# Patient Record
Sex: Female | Born: 1965 | Hispanic: Yes | Smoking: Never smoker
Health system: Southern US, Community
[De-identification: ages and names within clinical notes are randomized; demographics above are authoritative.]

## PROBLEM LIST (undated history)

## (undated) DIAGNOSIS — Z87898 Personal history of other specified conditions: Secondary | ICD-10-CM

## (undated) DIAGNOSIS — Z9289 Personal history of other medical treatment: Secondary | ICD-10-CM

## (undated) DIAGNOSIS — R7303 Prediabetes: Secondary | ICD-10-CM

## (undated) DIAGNOSIS — N6009 Solitary cyst of unspecified breast: Secondary | ICD-10-CM

## (undated) DIAGNOSIS — E559 Vitamin D deficiency, unspecified: Secondary | ICD-10-CM

## (undated) HISTORY — DX: Vitamin D deficiency, unspecified: E55.9

## (undated) HISTORY — DX: Solitary cyst of unspecified breast: N60.09

## (undated) HISTORY — DX: Personal history of other specified conditions: Z87.898

## (undated) HISTORY — DX: Personal history of other medical treatment: Z92.89

## (undated) HISTORY — DX: Prediabetes: R73.03

---

## 2011-08-08 ENCOUNTER — Ambulatory Visit: Payer: Self-pay | Admitting: Internal Medicine

## 2014-04-14 ENCOUNTER — Ambulatory Visit: Payer: Self-pay

## 2014-04-15 DIAGNOSIS — N6009 Solitary cyst of unspecified breast: Secondary | ICD-10-CM

## 2014-04-15 HISTORY — DX: Solitary cyst of unspecified breast: N60.09

## 2014-04-25 ENCOUNTER — Ambulatory Visit: Payer: Self-pay

## 2015-09-06 ENCOUNTER — Encounter: Payer: Self-pay | Admitting: Nurse Practitioner

## 2015-09-06 ENCOUNTER — Ambulatory Visit (INDEPENDENT_AMBULATORY_CARE_PROVIDER_SITE_OTHER): Payer: 59 | Admitting: Nurse Practitioner

## 2015-09-06 VITALS — BP 112/62 | HR 80 | Temp 98.1°F | Ht 61.0 in | Wt 165.0 lb

## 2015-09-06 DIAGNOSIS — B351 Tinea unguium: Secondary | ICD-10-CM

## 2015-09-06 DIAGNOSIS — Z7189 Other specified counseling: Secondary | ICD-10-CM | POA: Diagnosis not present

## 2015-09-06 DIAGNOSIS — R252 Cramp and spasm: Secondary | ICD-10-CM | POA: Diagnosis not present

## 2015-09-06 DIAGNOSIS — F4541 Pain disorder exclusively related to psychological factors: Secondary | ICD-10-CM | POA: Diagnosis not present

## 2015-09-06 DIAGNOSIS — Z0000 Encounter for general adult medical examination without abnormal findings: Secondary | ICD-10-CM | POA: Insufficient documentation

## 2015-09-06 DIAGNOSIS — Z7689 Persons encountering health services in other specified circumstances: Secondary | ICD-10-CM

## 2015-09-06 NOTE — Patient Instructions (Signed)
Welcome to [facility]! Nice to meet you.   There are several choices over the counter- go to your local pharmacy   Lamisil   Clotrimazole   Fungi Nail  See you when you need me!

## 2015-09-06 NOTE — Assessment & Plan Note (Signed)
Discussed acute and chronic issues. Reviewed health maintenance measures, PFSHx, and immunizations. Obtain records from [facility].   

## 2015-09-06 NOTE — Progress Notes (Signed)
Patient ID: Adrienne Wallace, female    DOB: 29-Nov-1965  Age: 50 y.o. MRN: [id number]  CC: New Patient (Initial Visit)   HPI Adrienne Wallace presents for establishing care and CC of HA and leg cramps.   1) New Pt Info:   Immunizations- Unsure   Mammogram- [facility]   Pap- March 2017 approximately   2) Chronic Problems-  HA and legs are tender at night time   3) Acute Problems-  HA that start occipital and move to front    2 weeks ago started    Ibuprofen or excedrin and resting  Legs hurt at night- pulsating aching   Drinking good water    Adrienne Wallace has a past medical Adrienne of Adrienne of headache.   She has past surgical Adrienne that includes Cesarean section (1994).   Her family Adrienne includes Alzheimer's disease in her maternal grandmother; Drug abuse in her brother; Other in her mother.She reports that she has never smoked. She has never used smokeless tobacco. She reports that she drinks alcohol. She reports that she does not use illicit drugs.  No outpatient prescriptions prior to visit.   No facility-administered medications prior to visit.    ROS Review of Systems  Constitutional: Negative for fever, chills, diaphoresis and fatigue.  Respiratory: Negative for chest tightness, shortness of breath and wheezing.   Cardiovascular: Negative for chest pain, palpitations and leg swelling.  Gastrointestinal: Negative for nausea, vomiting and diarrhea.  Musculoskeletal: Positive for myalgias.  Skin: Negative for rash.  Neurological: Positive for headaches. Negative for dizziness, weakness and numbness.  Psychiatric/Behavioral: The patient is not nervous/anxious.     Objective:  BP 112/62 mmHg  Pulse 80  Temp(Src) 98.1 F (36.7 C) (Oral)  Ht 5\' 1"  (1.549 m)  Wt 165 lb (74.844 kg)  BMI 31.19 kg/m2  SpO2 97%  LMP 08/20/2015 (Approximate)  Physical Exam  Constitutional: She is oriented to person, place, and time. She appears well-developed and  well-nourished. No distress.  HENT:  Head: Normocephalic and atraumatic.  Right Ear: External ear normal.  Left Ear: External ear normal.  Cardiovascular: Normal rate and regular rhythm.   Pulmonary/Chest: Effort normal and breath sounds normal. No respiratory distress. She has no wheezes. She has no rales. She exhibits no tenderness.  Neurological: She is alert and oriented to person, place, and time.  Skin: Skin is warm and dry. No rash noted. She is not diaphoretic.  Great toenail (cannot remember, which foot) thick yellow nail only 10% top of nail on lateral side  Psychiatric: She has a normal mood and affect. Her behavior is normal. Judgment and thought content normal.   Assessment & Plan:   Adrienne Wallace was seen today for new patient (initial visit).  Diagnoses and all orders for this visit:  Encounter to establish care  Stress headaches  Cramp of both lower extremities   Adrienne Wallace does not currently have medications on file.  No orders of the defined types were placed in this encounter.     Follow-up: Return if symptoms worsen or fail to improve.

## 2015-09-12 DIAGNOSIS — B351 Tinea unguium: Secondary | ICD-10-CM | POA: Insufficient documentation

## 2015-09-12 NOTE — Assessment & Plan Note (Signed)
Stable currently and controlled with excedrin and rest, discussed heat, stretching, massages, OTCs, and possibly trying allergy medications. FU prn worsening/failure to improve.  (new to me)

## 2015-09-12 NOTE — Assessment & Plan Note (Addendum)
Likely fungal Want her to try OTC's first due to small amount of involvement  New to me FU prn worsening/failure to improve.

## 2015-09-12 NOTE — Assessment & Plan Note (Signed)
Discussed stretching before bedtime and proper direction to stretch, water intake, and possibly iron or b12 in a multivitamin form (new to me)

## 2016-08-22 ENCOUNTER — Encounter: Payer: Self-pay | Admitting: Advanced Practice Midwife

## 2016-08-22 ENCOUNTER — Ambulatory Visit (INDEPENDENT_AMBULATORY_CARE_PROVIDER_SITE_OTHER): Payer: 59 | Admitting: Advanced Practice Midwife

## 2016-08-22 VITALS — BP 120/80 | HR 88 | Ht 61.0 in | Wt 164.0 lb

## 2016-08-22 DIAGNOSIS — Z124 Encounter for screening for malignant neoplasm of cervix: Secondary | ICD-10-CM | POA: Diagnosis not present

## 2016-08-22 DIAGNOSIS — Z01419 Encounter for gynecological examination (general) (routine) without abnormal findings: Secondary | ICD-10-CM

## 2016-08-22 NOTE — Progress Notes (Signed)
Patient ID: Adrienne Wallace, female   DOB: Dec 03, 1965, 51 y.o.   MRN: [id number]     Gynecology Annual Exam  PCP: Carollee Leitz, NP  Chief Complaint:  Chief Complaint  Patient presents with  . Gynecologic Exam    History of Present Illness: Patient is a 51 y.o. [id number] presents for annual exam. The patient has no complaints today.   LMP: Patient's last menstrual period was 07/24/2016. Average Interval: regular, 30 days Duration of flow: 2 weeks Heavy Menses: yes Clots: no Intermenstrual Bleeding: no Postcoital Bleeding: no Dysmenorrhea: no   The patient is sexually active. She currently uses None for contraception. She denies dyspareunia.  The patient does occasionally perform self breast exams.  There is no notable family history of breast or ovarian cancer in her family.  The patient wears seatbelts: yes.   The patient has regular exercise: no.  She admits being active at work on her feet but denies cardiac workout. She admits to healthy diet and adequate hydration. She admits good support from family and friends.  The patient denies current symptoms of depression.    Review of Systems: Review of Systems  Constitutional: Negative.   HENT: Negative.   Eyes: Negative.   Respiratory: Negative.   Cardiovascular: Negative.   Gastrointestinal: Negative.   Genitourinary: Negative.   Musculoskeletal: Negative.   Skin: Negative.   Neurological: Negative.   Endo/Heme/Allergies: Negative.   Psychiatric/Behavioral: Negative.     Past Medical History:  Past Medical History:  Diagnosis Date  . History of headache     Past Surgical History:  Past Surgical History:  Procedure Laterality Date  . CESAREAN SECTION  1994    Gynecologic History:  Patient's last menstrual period was 07/24/2016. Contraception: none Last Pap: Results were: 1 year ago and no abnormalities  Last mammogram: 1 year ago and was normal Obstetric History: [id number]  Family History:  Family  History  Problem Relation Age of Onset  . Other Mother     blood clot in leg  . Drug abuse Brother   . Alzheimer's disease Maternal Grandmother     Social History:  Social History   Social History  . Marital status: Legally Separated    Spouse name: N/A  . Number of children: 4  . Years of education: College   Occupational History  .  Other    [employer]   Social History Main Topics  . Smoking status: Never Smoker  . Smokeless tobacco: Never Used  . Alcohol use 0.0 oz/week     Comment: occasionally  . Drug use: No  . Sexual activity: Yes    Birth control/ protection: None   Other Topics Concern  . Not on file   Social History Narrative   Patient lives at home with family    Husband and 4 children, and mother-in-Law    Caffeine- 1 cup daily    Exercise- walking    Works at [employer]        Allergies:  No Known Allergies  Medications: Prior to Admission medications   Not on File    Physical Exam Vitals: Blood pressure 120/80, pulse 88, height  (1.549 m), weight 164 lb (74.4 kg), last menstrual period 07/24/2016.  General: NAD HEENT: normocephalic, anicteric Thyroid: no enlargement, no palpable nodules Pulmonary: No increased work of breathing, CTAB Cardiovascular: RRR, distal pulses 2+ Breast: Breast symmetrical, no tenderness, no palpable nodules or masses, no skin or nipple retraction present, no nipple discharge.  No axillary  or supraclavicular lymphadenopathy. Abdomen: NABS, soft, non-tender, non-distended.  Umbilicus without lesions.  No hepatomegaly, splenomegaly or masses palpable. No evidence of hernia  Genitourinary:  External: Normal external female genitalia.  Normal urethral meatus, normal  Bartholin's and Skene's glands.    Vagina: Normal vaginal mucosa, no evidence of prolapse.    Cervix: Grossly normal in appearance, no bleeding  Uterus: Non-enlarged, mobile, normal contour.  No CMT  Adnexa: ovaries non-enlarged, no adnexal  masses  Rectal: deferred  Lymphatic: no evidence of inguinal lymphadenopathy Extremities: no edema, erythema, or tenderness Neurologic: Grossly intact Psychiatric: mood appropriate, affect full     Assessment: 51 y.o. [id number] Well Woman exam with PAP smear   Plan: Problem List Items Addressed This Visit    None      1) Mammogram - recommend yearly screening mammogram.  Mammogram patient will self schedule following today's annual exam  2) STI screening was offered and declined  3) ASCCP guidelines and rational discussed.  Patient opts for yearly screening interval  4) Contraception - Education given regarding options for contraception. Pt is aware that the chances of pregnancy are low but still possible. She chooses no method of contraception.  5) Continue healthy lifestyle diet and increase activity level to add cardiac benefits  6) Follow up in 1 year for annual   [place] Mall, [place]

## 2016-08-23 LAB — PAP IG (IMAGE GUIDED): PAP SMEAR COMMENT: 0

## 2016-10-22 ENCOUNTER — Other Ambulatory Visit: Payer: Self-pay | Admitting: Family

## 2016-10-22 ENCOUNTER — Ambulatory Visit (INDEPENDENT_AMBULATORY_CARE_PROVIDER_SITE_OTHER): Payer: 59 | Admitting: Family

## 2016-10-22 ENCOUNTER — Encounter: Payer: Self-pay | Admitting: Family

## 2016-10-22 VITALS — BP 122/76 | HR 79 | Temp 98.2°F | Ht 61.0 in | Wt 164.4 lb

## 2016-10-22 DIAGNOSIS — M7989 Other specified soft tissue disorders: Secondary | ICD-10-CM

## 2016-10-22 DIAGNOSIS — M25562 Pain in left knee: Secondary | ICD-10-CM

## 2016-10-22 NOTE — Progress Notes (Signed)
Subjective:    Patient ID: Adrienne Wallace, female    DOB: 09-16-1965, 51 y.o.   MRN: [id number]  CC: Adrienne Wallace is a 51 y.o. female who presents today for an acute visit.    HPI: Acute left knee pain x one month, unchanged.  No falls or injury. Describes swelling in left knee. No catching or giving way.   Brace helps. Hasn't tried any medication  Thinks walking on hard concrete at work bothers knee.  Also bothered  By varicose veins on both legs. No SOB, CP, palpitations, fever.  No h/o dvt.      HISTORY:  Past Medical History:  Diagnosis Date  . History of headache    Past Surgical History:  Procedure Laterality Date  . CESAREAN SECTION  1994   Family History  Problem Relation Age of Onset  . Other Mother        blood clot in leg  . Drug abuse Brother   . Alzheimer's disease Maternal Grandmother     Allergies: Patient has no known allergies. No current outpatient prescriptions on file prior to visit.   No current facility-administered medications on file prior to visit.     Social History  Substance Use Topics  . Smoking status: Never Smoker  . Smokeless tobacco: Never Used  . Alcohol use 0.0 oz/week     Comment: occasionally    Review of Systems  Constitutional: Negative for chills and fever.  Respiratory: Negative for cough.   Cardiovascular: Negative for chest pain and palpitations.  Gastrointestinal: Negative for nausea and vomiting.  Musculoskeletal: Positive for joint swelling. Negative for back pain.      Objective:    BP 122/76   Pulse 79   Temp 98.2 F (36.8 C) (Oral)   Ht 5\' 1"  (1.549 m)   Wt 164 lb 6.4 oz (74.6 kg)   SpO2 97%   BMI 31.06 kg/m    Physical Exam  Constitutional: She appears well-developed and well-nourished.  Eyes: Conjunctivae are normal.  Cardiovascular: Normal rate, regular rhythm, normal heart sounds and normal pulses.   No LE edema, palpable cords or masses. No erythema or increased warmth. Slight  asymmetry in left calf size when compared to right.  LE hair growth symmetric and present.  varicosities noted. LE warm and palpable pedal pulses.   Pulmonary/Chest: Effort normal and breath sounds normal. She has no wheezes. She has no rhonchi. She has no rales.  Musculoskeletal:       Left knee: She exhibits swelling. She exhibits normal range of motion, no effusion, no ecchymosis, no deformity and no erythema. No tenderness found.  Bilateral knees are symmetric. Left knee slightly larger than right.  No increase in warmth or erythema. Crepitus felt with flexion of bilateral knees.  Left  knee:  Able to extend to -5 to 10 degrees and flex to 110 degrees. No catching with McMurray maneuver. No patellar apprehension. Negative anterior drawer and lachman's- no laxity appreciated.  No calf tenderness of lower leg edema bilaterally.    Neurological: She is alert.  Skin: Skin is warm and dry.  Psychiatric: She has a normal mood and affect. Her speech is normal and behavior is normal. Thought content normal.  Vitals reviewed.      Assessment & Wallace:    1. Acute pain of left knee Suspect arthritic in nature. She does appear to have a mild joint effusion. We discussed conservative therapy including ice, compression to help improve this. Her left calf does  also appear larger than her right calf which may be baseline however pending stat ultrasound to ensure no DVT. Also referred to vascular due to varicosities noted on legs. Advised conservative therapy does not help with left knee pain, we will consult orthopedics. - US Venous Img Lower Unilateral Left - Ambulatory referral to Vascular Surgery   Adrienne Wallace does not currently have medications on file.   No orders of the defined types were placed in this encounter.   Return precautions given.   Risks, benefits, and alternatives of the medications and treatment Wallace prescribed today were discussed, and patient expressed understanding.     Education regarding symptom management and diagnosis given to patient on AVS.  Continue to follow with Allegra Grana, FNP for routine health maintenance.   Adrienne Wallace.   Rennie Plowman, FNP

## 2016-10-22 NOTE — Patient Instructions (Signed)
US left leg  Referral to vascular  As discussed, suspect  arthritis of left knee.  Ice Compression Elevation ( for swelling)  Ibuprofen as needed for pain

## 2016-10-22 NOTE — Progress Notes (Signed)
Pre visit review using our clinic review tool, if applicable. No additional management support is needed unless otherwise documented below in the visit note. 

## 2016-10-23 ENCOUNTER — Telehealth: Payer: Self-pay | Admitting: Family

## 2016-10-23 NOTE — Telephone Encounter (Signed)
Per patient request she needed to wait till Thursday 6/14 to get her ultrasound. I was not able to schedule for that day, so I scheduled it for Friday. She is not able to do Friday since she works 8-5:30. She requested her appt to be on Tuesday June 19. I have scheduled it for her on this date. I asked her how her leg was feeling. She said that it only hurts when she is standing at work. I told her if her leg starts swelling, warm to touch or pain level increases to please contact us.

## 2016-10-23 NOTE — Telephone Encounter (Signed)
Noted! Thank you

## 2016-10-25 ENCOUNTER — Ambulatory Visit: Payer: 59

## 2016-10-29 ENCOUNTER — Ambulatory Visit
Admission: RE | Admit: 2016-10-29 | Discharge: 2016-10-29 | Disposition: A | Discharge status: Home / Self Care | Payer: 59 | Source: Ambulatory Visit | Attending: Family | Admitting: Family

## 2016-10-29 DIAGNOSIS — M7989 Other specified soft tissue disorders: Secondary | ICD-10-CM | POA: Insufficient documentation

## 2017-01-06 ENCOUNTER — Other Ambulatory Visit: Payer: Self-pay | Admitting: Advanced Practice Midwife

## 2017-01-06 DIAGNOSIS — Z1231 Encounter for screening mammogram for malignant neoplasm of breast: Secondary | ICD-10-CM

## 2017-01-17 ENCOUNTER — Ambulatory Visit
Admission: RE | Admit: 2017-01-17 | Discharge: 2017-01-17 | Disposition: A | Discharge status: Home / Self Care | Payer: 59 | Source: Ambulatory Visit | Attending: Advanced Practice Midwife | Admitting: Advanced Practice Midwife

## 2017-01-17 DIAGNOSIS — Z1231 Encounter for screening mammogram for malignant neoplasm of breast: Secondary | ICD-10-CM | POA: Diagnosis present

## 2017-01-22 ENCOUNTER — Other Ambulatory Visit: Payer: Self-pay | Admitting: *Deleted

## 2017-01-22 ENCOUNTER — Inpatient Hospital Stay
Admission: RE | Admit: 2017-01-22 | Discharge: 2017-01-22 | Disposition: A | Discharge status: Home / Self Care | Payer: Self-pay | Source: Ambulatory Visit | Attending: *Deleted | Admitting: *Deleted

## 2017-01-22 DIAGNOSIS — Z9289 Personal history of other medical treatment: Secondary | ICD-10-CM

## 2017-01-30 ENCOUNTER — Encounter: Payer: Self-pay | Admitting: Family

## 2017-01-30 ENCOUNTER — Ambulatory Visit (INDEPENDENT_AMBULATORY_CARE_PROVIDER_SITE_OTHER): Payer: 59 | Admitting: Family

## 2017-01-30 VITALS — BP 128/78 | HR 83 | Temp 98.2°F | Ht 61.0 in | Wt 163.4 lb

## 2017-01-30 DIAGNOSIS — M25561 Pain in right knee: Secondary | ICD-10-CM | POA: Diagnosis not present

## 2017-01-30 DIAGNOSIS — Z0000 Encounter for general adult medical examination without abnormal findings: Secondary | ICD-10-CM | POA: Diagnosis not present

## 2017-01-30 DIAGNOSIS — G8929 Other chronic pain: Secondary | ICD-10-CM

## 2017-01-30 DIAGNOSIS — M25562 Pain in left knee: Secondary | ICD-10-CM

## 2017-01-30 LAB — COMPREHENSIVE METABOLIC PANEL
ALK PHOS: 66 U/L (ref 39–117)
ALT: 10 U/L (ref 0–35)
AST: 13 U/L (ref 0–37)
Albumin: 3.8 g/dL (ref 3.5–5.2)
BILIRUBIN TOTAL: 0.3 mg/dL (ref 0.2–1.2)
BUN: 15 mg/dL (ref 6–23)
CO2: 28 mEq/L (ref 19–32)
CREATININE: 0.52 mg/dL (ref 0.40–1.20)
Calcium: 8.8 mg/dL (ref 8.4–10.5)
Chloride: 107 mEq/L (ref 96–112)
GFR: 132.97 mL/min (ref >60.00)
GLUCOSE: 95 mg/dL (ref 70–99)
Potassium: 4.1 mEq/L (ref 3.5–5.1)
SODIUM: 140 meq/L (ref 135–145)
TOTAL PROTEIN: 6.8 g/dL (ref 6.0–8.3)

## 2017-01-30 LAB — LIPID PANEL
CHOL/HDL RATIO: 3
Cholesterol: 174 mg/dL (ref 0–200)
HDL: 58.6 mg/dL (ref >39.00)
LDL Cholesterol: 96 mg/dL (ref 0–99)
NONHDL: 115.34
Triglycerides: 97 mg/dL (ref 0.0–149.0)
VLDL: 19.4 mg/dL (ref 0.0–40.0)

## 2017-01-30 LAB — CBC WITH DIFFERENTIAL/PLATELET
BASOS PCT: 0.6 % (ref 0.0–3.0)
Basophils Absolute: 0 10*3/uL (ref 0.0–0.1)
EOS ABS: 0.1 10*3/uL (ref 0.0–0.7)
EOS PCT: 2.5 % (ref 0.0–5.0)
HCT: 31.9 % — ABNORMAL LOW (ref 36.0–46.0)
Hemoglobin: 10.1 g/dL — ABNORMAL LOW (ref 12.0–15.0)
Lymphocytes Relative: 33.1 % (ref 12.0–46.0)
Lymphs Abs: 1.6 10*3/uL (ref 0.7–4.0)
MCHC: 31.5 g/dL (ref 30.0–36.0)
MCV: 82.4 fl (ref 78.0–100.0)
Monocytes Absolute: 0.3 10*3/uL (ref 0.1–1.0)
Monocytes Relative: 6.2 % (ref 3.0–12.0)
NEUTROS ABS: 2.8 10*3/uL (ref 1.4–7.7)
NEUTROS PCT: 57.6 % (ref 43.0–77.0)
Platelets: 331 10*3/uL (ref 150.0–400.0)
RBC: 3.87 Mil/uL (ref 3.87–5.11)
RDW: 16.7 % — ABNORMAL HIGH (ref 11.5–15.5)
WBC: 4.8 10*3/uL (ref 4.0–10.5)

## 2017-01-30 LAB — TSH: TSH: 2.21 u[IU]/mL (ref 0.35–4.50)

## 2017-01-30 LAB — VITAMIN D 25 HYDROXY (VIT D DEFICIENCY, FRACTURES): VITD: 6.96 ng/mL — AB (ref 30.00–100.00)

## 2017-01-30 LAB — HEMOGLOBIN A1C: Hgb A1c MFr Bld: 5.9 % (ref 4.6–6.5)

## 2017-01-30 MED ORDER — MELOXICAM 7.5 MG PO TABS: 7.5000 mg | ORAL_TABLET | Freq: Every day | ORAL | 1 refills | Status: DC

## 2017-01-30 NOTE — Patient Instructions (Addendum)
Tdap vaccine at local pharmacy  Labs today  Trial mobic every day. Please stop taking any ibuprofen, aleve, advil ect as these medications are similar to mobic  Ice  Ace wrap or compression stockings  Supportive shoes   If not better letter me know  Referral to derm to evaluate lesion on right leg.   Health Maintenance, Female Adopting a healthy lifestyle and getting preventive care can go a long way to promote health and wellness. Talk with your health care provider about what schedule of regular examinations is right for you. This is a good chance for you to check in with your provider about disease prevention and staying healthy. In between checkups, there are plenty of things you can do on your own. Experts have done a lot of research about which lifestyle changes and preventive measures are most likely to keep you healthy. Ask your health care provider for more information. Weight and diet Eat a healthy diet  Be sure to include plenty of vegetables, fruits, low-fat dairy products, and lean protein.  Do not eat a lot of foods high in solid fats, added sugars, or salt.  Get regular exercise. This is one of the most important things you can do for your health. ? Most adults should exercise for at least 150 minutes each week. The exercise should increase your heart rate and make you sweat (moderate-intensity exercise). ? Most adults should also do strengthening exercises at least twice a week. This is in addition to the moderate-intensity exercise.  Maintain a healthy weight  Body mass index (BMI) is a measurement that can be used to identify possible weight problems. It estimates body fat based on height and weight. Your health care provider can help determine your BMI and help you achieve or maintain a healthy weight.  For females 55 years of age and older: ? A BMI below 18.5 is considered underweight. ? A BMI of 18.5 to 24.9 is normal. ? A BMI of 25 to 29.9 is considered  overweight. ? A BMI of 30 and above is considered obese.  Watch levels of cholesterol and blood lipids  You should start having your blood tested for lipids and cholesterol at 49 years of age, then have this test every 5 years.  You may need to have your cholesterol levels checked more often if: ? Your lipid or cholesterol levels are high. ? You are older than 49 years of age. ? You are at high risk for heart disease.  Cancer screening Lung Cancer  Lung cancer screening is recommended for adults 9-39 years old who are at high risk for lung cancer because of a history of smoking.  A yearly low-dose CT scan of the lungs is recommended for people who: ? Currently smoke. ? Have quit within the past 15 years. ? Have at least a 30-pack-year history of smoking. A pack year is smoking an average of one pack of cigarettes a day for 1 year.  Yearly screening should continue until it has been 15 years since you quit.  Yearly screening should stop if you develop a health problem that would prevent you from having lung cancer treatment.  Breast Cancer  Practice breast self-awareness. This means understanding how your breasts normally appear and feel.  It also means doing regular breast self-exams. Let your health care provider know about any changes, no matter how small.  If you are in your 20s or 30s, you should have a clinical breast exam (CBE) by a health  care provider every 1-3 years as part of a regular health exam.  If you are 88 or older, have a CBE every year. Also consider having a breast X-ray (mammogram) every year.  If you have a family history of breast cancer, talk to your health care provider about genetic screening.  If you are at high risk for breast cancer, talk to your health care provider about having an MRI and a mammogram every year.  Breast cancer gene (BRCA) assessment is recommended for women who have family members with BRCA-related cancers. BRCA-related cancers  include: ? Breast. ? Ovarian. ? Tubal. ? Peritoneal cancers.  Results of the assessment will determine the need for genetic counseling and BRCA1 and BRCA2 testing.  Cervical Cancer Your health care provider may recommend that you be screened regularly for cancer of the pelvic organs (ovaries, uterus, and vagina). This screening involves a pelvic examination, including checking for microscopic changes to the surface of your cervix (Pap test). You may be encouraged to have this screening done every 3 years, beginning at age 67.  For women ages 15-65, health care providers may recommend pelvic exams and Pap testing every 3 years, or they may recommend the Pap and pelvic exam, combined with testing for human papilloma virus (HPV), every 5 years. Some types of HPV increase your risk of cervical cancer. Testing for HPV may also be done on women of any age with unclear Pap test results.  Other health care providers may not recommend any screening for nonpregnant women who are considered low risk for pelvic cancer and who do not have symptoms. Ask your health care provider if a screening pelvic exam is right for you.  If you have had past treatment for cervical cancer or a condition that could lead to cancer, you need Pap tests and screening for cancer for at least 20 years after your treatment. If Pap tests have been discontinued, your risk factors (such as having a new sexual partner) need to be reassessed to determine if screening should resume. Some women have medical problems that increase the chance of getting cervical cancer. In these cases, your health care provider may recommend more frequent screening and Pap tests.  Colorectal Cancer  This type of cancer can be detected and often prevented.  Routine colorectal cancer screening usually begins at 49 years of age and continues through 49 years of age.  Your health care provider may recommend screening at an earlier age if you have risk factors  for colon cancer.  Your health care provider may also recommend using home test kits to check for hidden blood in the stool.  A small camera at the end of a tube can be used to examine your colon directly (sigmoidoscopy or colonoscopy). This is done to check for the earliest forms of colorectal cancer.  Routine screening usually begins at age 21.  Direct examination of the colon should be repeated every 5-10 years through 49 years of age. However, you may need to be screened more often if early forms of precancerous polyps or small growths are found.  Skin Cancer  Check your skin from head to toe regularly.  Tell your health care provider about any new moles or changes in moles, especially if there is a change in a mole's shape or color.  Also tell your health care provider if you have a mole that is larger than the size of a pencil eraser.  Always use sunscreen. Apply sunscreen liberally and repeatedly throughout the  day.  Protect yourself by wearing long sleeves, pants, a wide-brimmed hat, and sunglasses whenever you are outside.  Heart disease, diabetes, and high blood pressure  High blood pressure causes heart disease and increases the risk of stroke. High blood pressure is more likely to develop in: ? People who have blood pressure in the high end of the normal range (130-139/85-89 mm Hg). ? People who are overweight or obese. ? People who are African American.  If you are 33-25 years of age, have your blood pressure checked every 3-5 years. If you are 54 years of age or older, have your blood pressure checked every year. You should have your blood pressure measured twice-once when you are at a hospital or clinic, and once when you are not at a hospital or clinic. Record the average of the two measurements. To check your blood pressure when you are not at a hospital or clinic, you can use: ? An automated blood pressure machine at a pharmacy. ? A home blood pressure monitor.  If  you are between 59 years and 73 years old, ask your health care provider if you should take aspirin to prevent strokes.  Have regular diabetes screenings. This involves taking a blood sample to check your fasting blood sugar level. ? If you are at a normal weight and have a low risk for diabetes, have this test once every three years after 49 years of age. ? If you are overweight and have a high risk for diabetes, consider being tested at a younger age or more often. Preventing infection Hepatitis B  If you have a higher risk for hepatitis B, you should be screened for this virus. You are considered at high risk for hepatitis B if: ? You were born in a country where hepatitis B is common. Ask your health care provider which countries are considered high risk. ? Your parents were born in a high-risk country, and you have not been immunized against hepatitis B (hepatitis B vaccine). ? You have HIV or AIDS. ? You use needles to inject street drugs. ? You live with someone who has hepatitis B. ? You have had sex with someone who has hepatitis B. ? You get hemodialysis treatment. ? You take certain medicines for conditions, including cancer, organ transplantation, and autoimmune conditions.  Hepatitis C  Blood testing is recommended for: ? Everyone born from [birth year] through 1963. ? Anyone with known risk factors for hepatitis C.  Sexually transmitted infections (STIs)  You should be screened for sexually transmitted infections (STIs) including gonorrhea and chlamydia if: ? You are sexually active and are younger than 49 years of age. ? You are older than 49 years of age and your health care provider tells you that you are at risk for this type of infection. ? Your sexual activity has changed since you were last screened and you are at an increased risk for chlamydia or gonorrhea. Ask your health care provider if you are at risk.  If you do not have HIV, but are at risk, it may be recommended  that you take a prescription medicine daily to prevent HIV infection. This is called pre-exposure prophylaxis (PrEP). You are considered at risk if: ? You are sexually active and do not regularly use condoms or know the HIV status of your partner(s). ? You take drugs by injection. ? You are sexually active with a partner who has HIV.  Talk with your health care provider about whether you are at high  risk of being infected with HIV. If you choose to begin PrEP, you should first be tested for HIV. You should then be tested every 3 months for as long as you are taking PrEP. Pregnancy  If you are premenopausal and you may become pregnant, ask your health care provider about preconception counseling.  If you may become pregnant, take 400 to 800 micrograms (mcg) of folic acid every day.  If you want to prevent pregnancy, talk to your health care provider about birth control (contraception). Osteoporosis and menopause  Osteoporosis is a disease in which the bones lose minerals and strength with aging. This can result in serious bone fractures. Your risk for osteoporosis can be identified using a bone density scan.  If you are 70 years of age or older, or if you are at risk for osteoporosis and fractures, ask your health care provider if you should be screened.  Ask your health care provider whether you should take a calcium or vitamin D supplement to lower your risk for osteoporosis.  Menopause may have certain physical symptoms and risks.  Hormone replacement therapy may reduce some of these symptoms and risks. Talk to your health care provider about whether hormone replacement therapy is right for you. Follow these instructions at home:  Schedule regular health, dental, and eye exams.  Stay current with your immunizations.  Do not use any tobacco products including cigarettes, chewing tobacco, or electronic cigarettes.  If you are pregnant, do not drink alcohol.  If you are  breastfeeding, limit how much and how often you drink alcohol.  Limit alcohol intake to no more than 1 drink per day for nonpregnant women. One drink equals 12 ounces of beer, 5 ounces of wine, or 1 ounces of hard liquor.  Do not use street drugs.  Do not share needles.  Ask your health care provider for help if you need support or information about quitting drugs.  Tell your health care provider if you often feel depressed.  Tell your health care provider if you have ever been abused or do not feel safe at home. This information is not intended to replace advice given to you by your health care provider. Make sure you discuss any questions you have with your health care provider. Document Released: 11/12/2010 Document Revised: 10/05/2015 Document Reviewed: 01/31/2015 Elsevier Interactive Patient Education  [employer].

## 2017-01-30 NOTE — Assessment & Plan Note (Signed)
Symptoms consistent with degenerative disease, suspect that nature of work and standing all day has contributed. We'll trial conservative therapy including Mobic, ice, compression hose or Ace wrap. If no improvement, will refer patient to orthopedics for further evaluation.

## 2017-01-30 NOTE — Assessment & Plan Note (Signed)
Mammogram up-to-date. Patient follows at [facility] for Pap smear which she states also up-to-date. Advised her to have tetanus at local pharmacy. Screening labs including HIV consented for. Encouraged exercise. Referral dermatology for skin check as well as evaluation of lesion on right lower extremity.

## 2017-01-30 NOTE — Progress Notes (Signed)
Subjective:    Patient ID: Adrienne Wallace, female    DOB: 07-05-1965, 51 y.o.   MRN: [id number]  CC: Adrienne Wallace is a 51 y.o. female who presents today for physical exam.    HPI: Bilateral knee pain for past year, unchanged.  Knees feels like giving out . Stairs are hard to do. Pain worse after sitting for long periods of time. Hasnt tried any medication. For work, stands all day at [employer] at concrete floor. Not wearing supportive shoes.  Pain with walking      Colorectal Cancer Screening: no early family history Breast Cancer Screening: Mammogram UTD Cervical Cancer Screening: UTD, [facility].  Bone Health screening/DEXA for 65+: No increased fracture risk. Defer screening at this time. Lung Cancer Screening: Doesn't have 30 year pack year history and age > 55 years.       Tetanus - due         HIV Screening- Candidate for, consents.  Labs: Screening labs today. Exercise: Gets regular exercise.  Alcohol use: occasionally Smoking/tobacco use: Nonsmoker.  Regular dental exams:UTD Wears seat belt: Yes. Skin: no new lesions; no skin cancer.     HISTORY:  Past Medical History:  Diagnosis Date  . History of headache     Past Surgical History:  Procedure Laterality Date  . CESAREAN SECTION  1994   Family History  Problem Relation Age of Onset  . Other Mother        blood clot in leg  . Drug abuse Brother   . Alzheimer's disease Maternal Grandmother   . Breast cancer Neg Hx       ALLERGIES: Patient has no known allergies.  No current outpatient prescriptions on file prior to visit.   No current facility-administered medications on file prior to visit.     Social History  Substance Use Topics  . Smoking status: Never Smoker  . Smokeless tobacco: Never Used  . Alcohol use 0.0 oz/week     Comment: occasionally    Review of Systems  Constitutional: Negative for chills, fever and unexpected weight change.  HENT: Negative for  congestion.   Respiratory: Negative for cough.   Cardiovascular: Negative for chest pain, palpitations and leg swelling.  Gastrointestinal: Negative for nausea and vomiting.  Musculoskeletal: Negative for arthralgias and myalgias.  Skin: Negative for color change and rash.  Neurological: Negative for headaches.  Hematological: Negative for adenopathy.  Psychiatric/Behavioral: Negative for confusion.      Objective:    BP 128/78   Pulse 83   Temp 98.2 F (36.8 C) (Oral)   Ht  (1.549 m)   Wt 163 lb 6.4 oz (74.1 kg)   LMP 01/16/2017   SpO2 98%   BMI 30.87 kg/m   BP Readings from Last 3 Encounters:  01/30/17 128/78  10/22/16 122/76  08/22/16 120/80   Wt Readings from Last 3 Encounters:  01/30/17 163 lb 6.4 oz (74.1 kg)  10/22/16 164 lb 6.4 oz (74.6 kg)  08/22/16 164 lb (74.4 kg)    Physical Exam  Constitutional: She appears well-developed and well-nourished.  Eyes: Conjunctivae are normal.  Neck: No thyroid mass and no thyromegaly present.  Cardiovascular: Normal rate, regular rhythm, normal heart sounds and normal pulses.   Pulmonary/Chest: Effort normal and breath sounds normal. She has no wheezes. She has no rhonchi. She has no rales. Right breast exhibits no inverted nipple, no mass, no nipple discharge, no skin change and no tenderness. Left breast exhibits no inverted  nipple, no mass, no nipple discharge, no skin change and no tenderness. Breasts are symmetrical.  CBE performed.   Lymphadenopathy:       Head (right side): No submental, no submandibular, no tonsillar, no preauricular, no posterior auricular and no occipital adenopathy present.       Head (left side): No submental, no submandibular, no tonsillar, no preauricular, no posterior auricular and no occipital adenopathy present.    She has no cervical adenopathy.       Right cervical: No superficial cervical, no deep cervical and no posterior cervical adenopathy present.      Left cervical: No superficial  cervical, no deep cervical and no posterior cervical adenopathy present.    She has no axillary adenopathy.  Neurological: She is alert.  Skin: Skin is warm and dry.     Dark circular macule noted RLE as noted on diagram. Nonbleeding.   Psychiatric: She has a normal mood and affect. Her speech is normal and behavior is normal. Thought content normal.  Vitals reviewed.      Assessment & Plan:   Problem List Items Addressed This Visit      Other   Routine physical examination - Primary    Mammogram up-to-date. Patient follows at [facility] for Pap smear which she states also up-to-date. Advised her to have tetanus at local pharmacy. Screening labs including HIV consented for. Encouraged exercise. Referral dermatology for skin check as well as evaluation of lesion on right lower extremity.      Relevant Orders   CBC with Differential/Platelet   Comprehensive metabolic panel   Hemoglobin A1c   HIV antibody   Lipid panel   TSH   VITAMIN D 25 Hydroxy (Vit-D Deficiency, Fractures)   Ambulatory referral to Dermatology   Chronic pain of both knees    Symptoms consistent with degenerative disease, suspect that nature of work and standing all day has contributed. We'll trial conservative therapy including Mobic, ice, compression hose or Ace wrap. If no improvement, will refer patient to orthopedics for further evaluation.      Relevant Medications   meloxicam (MOBIC) 7.5 MG tablet       I am having Ms. Mayweather start on meloxicam.   Meds ordered this encounter  Medications  . meloxicam (MOBIC) 7.5 MG tablet    Sig: Take 1 tablet (7.5 mg total) by mouth daily.    Dispense:  90 tablet    Refill:  1    Order Specific Question:   Supervising Provider    Answer:   Sherlene Shams [[id number]]    Return precautions given.   Risks, benefits, and alternatives of the medications and treatment plan prescribed today were discussed, and patient expressed understanding.   Education regarding  symptom management and diagnosis given to patient on AVS.   Continue to follow with Allegra Grana, FNP for routine health maintenance.   Ferdinand Lango and I agreed with plan.   Rennie Plowman, FNP

## 2017-01-31 LAB — HIV ANTIBODY (ROUTINE TESTING W REFLEX): HIV: NONREACTIVE

## 2017-02-03 ENCOUNTER — Other Ambulatory Visit: Payer: Self-pay | Admitting: Family

## 2017-02-03 DIAGNOSIS — D649 Anemia, unspecified: Secondary | ICD-10-CM | POA: Insufficient documentation

## 2017-10-13 ENCOUNTER — Encounter: Payer: Self-pay | Admitting: Internal Medicine

## 2017-10-13 ENCOUNTER — Telehealth: Payer: Self-pay | Admitting: Internal Medicine

## 2017-10-13 ENCOUNTER — Ambulatory Visit: Payer: 59 | Admitting: Internal Medicine

## 2017-10-13 VITALS — BP 120/60 | HR 78 | Temp 98.4°F | Ht 61.0 in | Wt 168.2 lb

## 2017-10-13 DIAGNOSIS — R200 Anesthesia of skin: Secondary | ICD-10-CM

## 2017-10-13 DIAGNOSIS — E538 Deficiency of other specified B group vitamins: Secondary | ICD-10-CM

## 2017-10-13 DIAGNOSIS — E559 Vitamin D deficiency, unspecified: Secondary | ICD-10-CM | POA: Insufficient documentation

## 2017-10-13 DIAGNOSIS — R7303 Prediabetes: Secondary | ICD-10-CM

## 2017-10-13 DIAGNOSIS — R202 Paresthesia of skin: Secondary | ICD-10-CM

## 2017-10-13 DIAGNOSIS — M2550 Pain in unspecified joint: Secondary | ICD-10-CM

## 2017-10-13 DIAGNOSIS — D509 Iron deficiency anemia, unspecified: Secondary | ICD-10-CM | POA: Diagnosis not present

## 2017-10-13 MED ORDER — CHOLECALCIFEROL 1.25 MG (50000 UT) PO CAPS: 50000.0000 [IU] | ORAL_CAPSULE | ORAL | 1 refills | Status: DC

## 2017-10-13 NOTE — Telephone Encounter (Signed)
Error see prior message to CMA  TMS

## 2017-10-13 NOTE — Patient Instructions (Addendum)
Follow up in 1 month with PCP  Take care  If labs normal we will refer you to neurology   Vitamin D Deficiency Vitamin D deficiency is when your body does not have enough vitamin D. Vitamin D is important to your body for many reasons:  It helps the body to absorb two important minerals, called calcium and phosphorus.  It plays a role in bone health.  It may help to prevent some diseases, such as diabetes and multiple sclerosis.  It plays a role in muscle function, including heart function.  You can get vitamin D by:  Eating foods that naturally contain vitamin D.  Eating or drinking milk or other dairy products that have vitamin D added to them.  Taking a vitamin D supplement or a multivitamin supplement that contains vitamin D.  Being in the sun. Your body naturally makes vitamin D when your skin is exposed to sunlight. Your body changes the sunlight into a form of the vitamin that the body can use.  If vitamin D deficiency is severe, it can cause a condition in which your bones become soft. In adults, this condition is called osteomalacia. In children, this condition is called rickets. What are the causes? Vitamin D deficiency may be caused by:  Not eating enough foods that contain vitamin D.  Not getting enough sun exposure.  Having certain digestive system diseases that make it difficult for your body to absorb vitamin D. These diseases include Crohn disease, chronic pancreatitis, and cystic fibrosis.  Having a surgery in which a part of the stomach or a part of the small intestine is removed.  Being obese.  Having chronic kidney disease or liver disease.  What increases the risk? This condition is more likely to develop in:  Older people.  People who do not spend much time outdoors.  People who live in a long-term care facility.  People who have had broken bones.  People with weak or thin bones (osteoporosis).  People who have a disease or condition that  changes how the body absorbs vitamin D.  People who have dark skin.  People who take certain medicines, such as steroid medicines or certain seizure medicines.  People who are overweight or obese.  What are the signs or symptoms? In mild cases of vitamin D deficiency, there may not be any symptoms. If the condition is severe, symptoms may include:  Bone pain.  Muscle pain.  Falling often.  Broken bones caused by a minor injury.  How is this diagnosed? This condition is usually diagnosed with a blood test. How is this treated? Treatment for this condition may depend on what caused the condition. Treatment options include:  Taking vitamin D supplements.  Taking a calcium supplement. Your health care provider will suggest what dose is best for you.  Follow these instructions at home:  Take medicines and supplements only as told by your health care provider.  Eat foods that contain vitamin D. Choices include: ? Fortified dairy products, cereals, or juices. Fortified means that vitamin D has been added to the food. Check the label on the package to be sure. ? Fatty fish, such as salmon or trout. ? Eggs. ? Oysters.  Do not use a tanning bed.  Maintain a healthy weight. Lose weight, if needed.  Keep all follow-up visits as told by your health care provider. This is important. Contact a health care provider if:  Your symptoms do not go away.  You feel like throwing up (nausea)  or you throw up (vomit).  You have fewer bowel movements than usual or it is difficult for you to have a bowel movement (constipation). This information is not intended to replace advice given to you by your health care provider. Make sure you discuss any questions you have with your health care provider. Document Released: 07/22/2011 Document Revised: 10/11/2015 Document Reviewed: 09/14/2014 Elsevier Interactive Patient Education  2018 [employer]  Paresthesia Paresthesia is an abnormal  burning or prickling sensation. This sensation is generally felt in the hands, arms, legs, or feet. However, it may occur in any part of the body. Usually, it is not painful. The feeling may be described as:  Tingling or numbness.  Pins and needles.  Skin crawling.  Buzzing.  Limbs falling asleep.  Itching.  Most people experience temporary (transient) paresthesia at some time in their lives. Paresthesia may occur when you breathe too quickly (hyperventilation). It can also occur without any apparent cause. Commonly, paresthesia occurs when pressure is placed on a nerve. The sensation quickly goes away after the pressure is removed. For some people, however, paresthesia is a long-lasting (chronic) condition that is caused by an underlying disorder. If you continue to have paresthesia, you may need further medical evaluation. Follow these instructions at home: Watch your condition for any changes. Taking the following actions may help to lessen any discomfort that you are feeling:  Avoid drinking alcohol.  Try acupuncture or massage to help relieve your symptoms.  Keep all follow-up visits as directed by your health care provider. This is important.  Contact a health care provider if:  You continue to have episodes of paresthesia.  Your burning or prickling feeling gets worse when you walk.  You have pain, cramps, or dizziness.  You develop a rash. Get help right away if:  You feel weak.  You have trouble walking or moving.  You have problems with speech, understanding, or vision.  You feel confused.  You cannot control your bladder or bowel movements.  You have numbness after an injury.  You faint. This information is not intended to replace advice given to you by your health care provider. Make sure you discuss any questions you have with your health care provider. Document Released: 04/19/2002 Document Revised: 10/05/2015 Document Reviewed: 04/25/2014 Elsevier  Interactive Patient Education  [employer]2018 [employer]

## 2017-10-13 NOTE — Telephone Encounter (Signed)
Call pt she did not go to lab today  Ask if can come back does not need to be fasting  sch lab visit   TMS

## 2017-10-13 NOTE — Progress Notes (Signed)
Pre visit review using our clinic review tool, if applicable. No additional management support is needed unless otherwise documented below in the visit note. 

## 2017-10-13 NOTE — Addendum Note (Signed)
Addended by: Warden FillersWRIGHT, Janely Gullickson S on: 10/13/2017 02:25 PM   Modules accepted: Orders

## 2017-10-13 NOTE — Progress Notes (Signed)
Chief Complaint  Patient presents with  . Acute Visit    right pinky ginger and toe pain/burning no injury   F/u  1. C/o right 5th toe burning and right 5th finger burning intermittently no injury duration x 1 year. She denies neck or low back pain but reports b/l knee pain chronically and stiffness in knees  2. Vit D def not taking any vitamin D and level was < 7 01/2017  3. Prediabetes noted 01/2017 disc'ed with patient today  4. Anemia noted last labs 01/2017 not taking any medications   Review of Systems  Constitutional: Negative for weight loss.  HENT: Negative for hearing loss.   Eyes: Negative for blurred vision.  Cardiovascular: Negative for chest pain.  Musculoskeletal: Positive for joint pain.  Skin: Negative for rash.  Neurological: Positive for sensory change. Negative for headaches.  Psychiatric/Behavioral: Negative for depression.   Past Medical History:  Diagnosis Date  . History of headache   . Prediabetes   . Vitamin D deficiency    Past Surgical History:  Procedure Laterality Date  . CESAREAN SECTION  1994   Family History  Problem Relation Age of Onset  . Other Mother        blood clot in leg  . Drug abuse Brother   . Alzheimer's disease Maternal Grandmother   . Breast cancer Neg Hx    Social History   Socioeconomic History  . Marital status: Legally Separated    Spouse name: Not on file  . Number of children: 4  . Years of education: College  . Highest education level: Not on file  Occupational History    Employer: OTHER    Comment: [employer]  Social Needs  . Financial resource strain: Not on file  . Food insecurity:    Worry: Not on file    Inability: Not on file  . Transportation needs:    Medical: Not on file    Non-medical: Not on file  Tobacco Use  . Smoking status: Never Smoker  . Smokeless tobacco: Never Used  Substance and Sexual Activity  . Alcohol use: Yes    Alcohol/week: 0.0 oz    Comment: occasionally  . Drug  use: No  . Sexual activity: Yes    Birth control/protection: None  Lifestyle  . Physical activity:    Days per week: Not on file    Minutes per session: Not on file  . Stress: Not on file  Relationships  . Social connections:    Talks on phone: Not on file    Gets together: Not on file    Attends religious service: Not on file    Active member of club or organization: Not on file    Attends meetings of clubs or organizations: Not on file    Relationship status: Not on file  . Intimate partner violence:    Fear of current or ex partner: Not on file    Emotionally abused: Not on file    Physically abused: Not on file    Forced sexual activity: Not on file  Other Topics Concern  . Not on file  Social History Narrative   Patient lives at home with family    Husband and 4 children, and mother-in-Law    Caffeine- 1 cup daily    Exercise- walking    Works at [employer]    No outpatient medications have been marked as taking for the 10/13/17 encounter (Office Visit) with McLean-Scocuzza, Pasty Spillersracy N, MD.   No  Known Allergies No results found for this or any previous visit (from the past 2160 hour(s)). Objective  Body mass index is 31.78 kg/m. Wt Readings from Last 3 Encounters:  10/13/17 168 lb 3.2 oz (76.3 kg)  01/30/17 163 lb 6.4 oz (74.1 kg)  10/22/16 164 lb 6.4 oz (74.6 kg)   Temp Readings from Last 3 Encounters:  10/13/17 98.4 F (36.9 C) (Oral)  01/30/17 98.2 F (36.8 C) (Oral)  10/22/16 98.2 F (36.8 C) (Oral)   BP Readings from Last 3 Encounters:  10/13/17 120/60  01/30/17 128/78  10/22/16 122/76   Pulse Readings from Last 3 Encounters:  10/13/17 78  01/30/17 83  10/22/16 79    Physical Exam  Constitutional: She is oriented to person, place, and time. Vital signs are normal. She appears well-developed and well-nourished. She is cooperative.  HENT:  Head: Normocephalic and atraumatic.  Mouth/Throat: Oropharynx is clear and moist and mucous membranes are normal.   Eyes: Pupils are equal, round, and reactive to light. Conjunctivae are normal.  Cardiovascular: Normal rate, regular rhythm and normal heart sounds.  Pulmonary/Chest: Effort normal and breath sounds normal.  Musculoskeletal:  B/l hands with OA changes and mild ttp dip jt and joint deformity right 5th finger    Neurological: She is alert and oriented to person, place, and time. Gait normal.  No sensory changes on exam right 5th finger and 5th toe    Skin: Skin is warm, dry and intact.  Psychiatric: She has a normal mood and affect. Her speech is normal and behavior is normal. Judgment and thought content normal. Cognition and memory are normal.  Nursing note and vitals reviewed.   Assessment   1. parathesia right 5th finger and right 5th toe  2. Vit D def  3. Anemia  4. Prediabetes  5. Chronic b/l knee pain and stiffness  Plan   1.  Labs today  If labs neg refer neurology N[facility] and further w/u  Denies neck and low back pain  2. 50K IU weekly x 6 months D3  3. Labs today  4. Reviewed with pt today needs healthy diet choices and exercise  5. Pt stopped mobic due to c/w long term side effects    Provider: Dr. French Ana McLean-Scocuzza-[facility]

## 2017-10-13 NOTE — Telephone Encounter (Signed)
Unable to leave message for patient to return call back, due to mailbox being full. P[facility] may give information and schedule lab appointment.

## 2017-10-14 ENCOUNTER — Other Ambulatory Visit: Payer: 59

## 2017-10-15 ENCOUNTER — Other Ambulatory Visit (INDEPENDENT_AMBULATORY_CARE_PROVIDER_SITE_OTHER): Payer: 59

## 2017-10-15 ENCOUNTER — Other Ambulatory Visit: Payer: 59

## 2017-10-15 DIAGNOSIS — M2550 Pain in unspecified joint: Secondary | ICD-10-CM

## 2017-10-15 DIAGNOSIS — R200 Anesthesia of skin: Secondary | ICD-10-CM

## 2017-10-15 DIAGNOSIS — E538 Deficiency of other specified B group vitamins: Secondary | ICD-10-CM

## 2017-10-15 DIAGNOSIS — E559 Vitamin D deficiency, unspecified: Secondary | ICD-10-CM | POA: Diagnosis not present

## 2017-10-15 DIAGNOSIS — D509 Iron deficiency anemia, unspecified: Secondary | ICD-10-CM

## 2017-10-15 DIAGNOSIS — R202 Paresthesia of skin: Secondary | ICD-10-CM

## 2017-10-15 NOTE — Addendum Note (Signed)
Addended by: Penne LashWIGGINS, Jahsir Rama N on: 10/15/2017 10:33 AM   Modules accepted: Orders

## 2017-10-15 NOTE — Addendum Note (Signed)
Addended by: WIGGINS, Adaora Mchaney N on: 10/15/2017 10:33 AM   Modules accepted: Orders  

## 2017-10-16 ENCOUNTER — Other Ambulatory Visit: Payer: Self-pay | Admitting: Internal Medicine

## 2017-10-17 LAB — COMPREHENSIVE METABOLIC PANEL
AG RATIO: 1.3 (calc) (ref 1.0–2.5)
ALKALINE PHOSPHATASE (APISO): 90 U/L (ref 33–130)
ALT: 13 U/L (ref 6–29)
AST: 16 U/L (ref 10–35)
Albumin: 4.1 g/dL (ref 3.6–5.1)
BUN: 17 mg/dL (ref 7–25)
CHLORIDE: 105 mmol/L (ref 98–110)
CO2: 25 mmol/L (ref 20–32)
CREATININE: 0.54 mg/dL (ref 0.50–1.05)
Calcium: 9.1 mg/dL (ref 8.6–10.4)
GLUCOSE: 100 mg/dL — AB (ref 65–99)
Globulin: 3.2 g/dL (calc) (ref 1.9–3.7)
Potassium: 4.3 mmol/L (ref 3.5–5.3)
Sodium: 140 mmol/L (ref 135–146)
Total Bilirubin: 0.4 mg/dL (ref 0.2–1.2)
Total Protein: 7.3 g/dL (ref 6.1–8.1)

## 2017-10-17 LAB — C-REACTIVE PROTEIN: CRP: 4.7 mg/L (ref <8.0)

## 2017-10-17 LAB — CBC WITH DIFFERENTIAL/PLATELET
BASOS ABS: 88 {cells}/uL (ref 0–200)
BASOS PCT: 1.4 %
EOS ABS: 202 {cells}/uL (ref 15–500)
Eosinophils Relative: 3.2 %
HCT: 35.5 % (ref 35.0–45.0)
Hemoglobin: 11.6 g/dL — ABNORMAL LOW (ref 11.7–15.5)
Lymphs Abs: 1928 cells/uL (ref 850–3900)
MCH: 26.1 pg — AB (ref 27.0–33.0)
MCHC: 32.7 g/dL (ref 32.0–36.0)
MCV: 80 fL (ref 80.0–100.0)
MONOS PCT: 7.1 %
MPV: 12.3 fL (ref 7.5–12.5)
Neutro Abs: 3635 cells/uL (ref 1500–7800)
Neutrophils Relative %: 57.7 %
PLATELETS: 330 10*3/uL (ref 140–400)
RBC: 4.44 10*6/uL (ref 3.80–5.10)
RDW: 15.8 % — ABNORMAL HIGH (ref 11.0–15.0)
TOTAL LYMPHOCYTE: 30.6 %
WBC: 6.3 10*3/uL (ref 3.8–10.8)
WBCMIX: 447 {cells}/uL (ref 200–950)

## 2017-10-17 LAB — VITAMIN B12: VITAMIN B 12: 388 pg/mL (ref 200–1100)

## 2017-10-17 LAB — TSH: TSH: 1.91 m[IU]/L

## 2017-10-17 LAB — RHEUMATOID FACTOR

## 2017-10-17 LAB — FOLATE: FOLATE: 12.5 ng/mL

## 2017-10-17 LAB — IRON,TIBC AND FERRITIN PANEL
%SAT: 12 % (calc) (ref 11–50)
Ferritin: 5 ng/mL — ABNORMAL LOW (ref 10–232)
Iron: 54 ug/dL (ref 45–160)
TIBC: 435 ug/dL (ref 250–450)

## 2017-10-17 LAB — T4, FREE: Free T4: 0.9 ng/dL (ref 0.8–1.8)

## 2017-10-17 LAB — URIC ACID: URIC ACID, SERUM: 3.3 mg/dL (ref 2.5–7.0)

## 2017-10-17 LAB — ANA: ANA: POSITIVE — AB

## 2017-10-17 LAB — VITAMIN D 25 HYDROXY (VIT D DEFICIENCY, FRACTURES): VIT D 25 HYDROXY: 6 ng/mL — AB (ref 30–100)

## 2017-10-17 LAB — ANTI-NUCLEAR AB-TITER (ANA TITER): ANA Titer 1: 1:80 {titer} — ABNORMAL HIGH

## 2017-10-17 LAB — SEDIMENTATION RATE: Sed Rate: 14 mm/h (ref 0–20)

## 2017-10-17 LAB — CYCLIC CITRUL PEPTIDE ANTIBODY, IGG: Cyclic Citrullin Peptide Ab: <16 UNITS

## 2017-11-14 ENCOUNTER — Other Ambulatory Visit: Payer: 59

## 2017-11-17 ENCOUNTER — Telehealth: Payer: Self-pay | Admitting: Radiology

## 2017-11-17 NOTE — Telephone Encounter (Signed)
She does not need labs tomorrow had labs 10/15/17 pretty comprehensive  Mal AmabileBrock call pt and make sure she understood labs prior 10/15/17 -rec rheumatology referral is she agreeable to that?   TMS

## 2017-11-17 NOTE — Telephone Encounter (Signed)
Pt coming in for labs tomorrow, please place future orders. Thanks  

## 2017-11-18 ENCOUNTER — Other Ambulatory Visit: Payer: 59

## 2017-11-18 NOTE — Telephone Encounter (Signed)
Patient was informed of results.  Patient understood and no questions, comments, or concerns at this time. Lab appointment is canceled and she is ok with referral.

## 2017-11-19 ENCOUNTER — Ambulatory Visit: Payer: 59 | Admitting: Family

## 2017-11-21 ENCOUNTER — Other Ambulatory Visit: Payer: Self-pay | Admitting: Internal Medicine

## 2017-11-21 DIAGNOSIS — R768 Other specified abnormal immunological findings in serum: Secondary | ICD-10-CM

## 2017-11-21 DIAGNOSIS — M2550 Pain in unspecified joint: Secondary | ICD-10-CM

## 2017-11-21 NOTE — Telephone Encounter (Signed)
Referral sent   tMS 

## 2017-11-26 ENCOUNTER — Ambulatory Visit (INDEPENDENT_AMBULATORY_CARE_PROVIDER_SITE_OTHER): Payer: BLUE CROSS/BLUE SHIELD | Admitting: Internal Medicine

## 2017-11-26 ENCOUNTER — Encounter: Payer: Self-pay | Admitting: Internal Medicine

## 2017-11-26 VITALS — BP 116/68 | HR 79 | Temp 98.7°F | Ht 61.0 in | Wt 170.4 lb

## 2017-11-26 DIAGNOSIS — E559 Vitamin D deficiency, unspecified: Secondary | ICD-10-CM | POA: Diagnosis not present

## 2017-11-26 DIAGNOSIS — R768 Other specified abnormal immunological findings in serum: Secondary | ICD-10-CM | POA: Diagnosis not present

## 2017-11-26 DIAGNOSIS — E611 Iron deficiency: Secondary | ICD-10-CM | POA: Diagnosis not present

## 2017-11-26 DIAGNOSIS — G8929 Other chronic pain: Secondary | ICD-10-CM

## 2017-11-26 DIAGNOSIS — Z1231 Encounter for screening mammogram for malignant neoplasm of breast: Secondary | ICD-10-CM | POA: Diagnosis not present

## 2017-11-26 DIAGNOSIS — D649 Anemia, unspecified: Secondary | ICD-10-CM

## 2017-11-26 DIAGNOSIS — M25561 Pain in right knee: Secondary | ICD-10-CM | POA: Diagnosis not present

## 2017-11-26 DIAGNOSIS — M25562 Pain in left knee: Secondary | ICD-10-CM | POA: Diagnosis not present

## 2017-11-26 DIAGNOSIS — M2550 Pain in unspecified joint: Secondary | ICD-10-CM | POA: Insufficient documentation

## 2017-11-26 MED ORDER — FERROUS SULFATE 325 (65 FE) MG PO TABS: 325.0000 mg | ORAL_TABLET | Freq: Every day | ORAL | 3 refills | Status: AC

## 2017-11-26 NOTE — Patient Instructions (Addendum)
Iron 325 mg 1 pill daily with vitamin C I.e orange juices or oranges  Show rheumatology your finger and your toe please disc with rheumatology if steroid shot will help pain in finger/knee or toe  Vitamin D take x 6 months then return to recheck  Tylenol 500 mg no more than 6 pills in 1 day if 325 mg no more than 9 pills in 1 day F/u in 4-6 months     Knee Pain, Adult Knee pain in adults is common. It can be caused by many things, including:  Arthritis.  A fluid-filled sac (cyst) or growth in your knee.  An infection in your knee.  An injury that will not heal.  Damage, swelling, or irritation of the tissues that support your knee.  Knee pain is usually not a sign of a serious problem. The pain may go away on its own with time and rest. If it does not, a health care provider may order tests to find the cause of the pain. These may include:  Imaging tests, such as an X-ray, MRI, or ultrasound.  Joint aspiration. In this test, fluid is removed from the knee.  Arthroscopy. In this test, a lighted tube is inserted into knee and an image is projected onto a TV screen.  A biopsy. In this test, a sample of tissue is removed from the body and studied under a microscope.  Follow these instructions at home: Pay attention to any changes in your symptoms. Take these actions to relieve your pain. Activity  Rest your knee.  Do not do things that cause pain or make pain worse.  Avoid high-impact activities or exercises, such as running, jumping rope, or doing jumping jacks. General instructions  Take over-the-counter and prescription medicines only as told by your health care provider.  Raise (elevate) your knee above the level of your heart when you are sitting or lying down.  Sleep with a pillow under your knee.  If directed, apply ice to the knee: ? Put ice in a plastic bag. ? Place a towel between your skin and the bag. ? Leave the ice on for 20 minutes, 2-3 times a  day.  Ask your health care provider if you should wear an elastic knee support.  Lose weight if you are overweight. Extra weight can put pressure on your knee.  Do not use any products that contain nicotine or tobacco, such as cigarettes and e-cigarettes. Smoking may slow the healing of any bone and joint problems that you may have. If you need help quitting, ask your health care provider. Contact a health care provider if:  Your knee pain continues, changes, or gets worse.  You have a fever along with knee pain.  Your knee buckles or locks up.  Your knee swells, and the swelling becomes worse. Get help right away if:  Your knee feels warm to the touch.  You cannot move your knee.  You have severe pain in your knee.  You have chest pain.  You have trouble breathing. Summary  Knee pain in adults is common. It can be caused by many things, including, arthritis, infection, cysts, or injury.  Knee pain is usually not a sign of a serious problem, but if it does not go away, a health care provider may perform tests to know the cause of the pain.  Pay attention to any changes in your symptoms. Relieve your pain with rest, medicines, light activity, and use of ice.  Get help if your  pain continues or becomes very severe, or if your knee buckles or locks up, or if you have chest pain or trouble breathing. This information is not intended to replace advice given to you by your health care provider. Make sure you discuss any questions you have with your health care provider. Document Released: 02/24/2007 Document Revised: 04/19/2016 Document Reviewed: 04/19/2016 Elsevier Interactive Patient Education  [employer].  Results for FABIANNA, KEATS (MRN [id number]) as of 11/26/2017 11:55  Ref. Range 10/15/2017 10:33  Sodium Latest Ref Range: 135 - 146 mmol/L 140  Potassium Latest Ref Range: 3.5 - 5.3 mmol/L 4.3  Chloride Latest Ref Range: 98 - 110 mmol/L 105  CO2 Latest Ref Range: 20  - 32 mmol/L 25  Glucose Latest Ref Range: 65 - 99 mg/dL 409 (H)  BUN Latest Ref Range: 7 - 25 mg/dL 17  Creatinine Latest Ref Range: 0.50 - 1.05 mg/dL 8.11  Calcium Latest Ref Range: 8.6 - 10.4 mg/dL 9.1  BUN/Creatinine Ratio Latest Ref Range: 6 - 22 (calc) NOT APPLICABLE  AG Ratio Latest Ref Range: 1.0 - 2.5 (calc) 1.3  Uric Acid, Serum Latest Ref Range: 2.5 - 7.0 mg/dL 3.3  AST Latest Ref Range: 10 - 35 U/L 16  ALT Latest Ref Range: 6 - 29 U/L 13  Total Protein Latest Ref Range: 6.1 - 8.1 g/dL 7.3  Total Bilirubin Latest Ref Range: 0.2 - 1.2 mg/dL 0.4    Results for CAROLLYN, ETCHEVERRY (MRN [id number]) as of 11/26/2017 11:55  Ref. Range 10/15/2017 10:33  Iron Latest Ref Range: 45 - 160 mcg/dL 54  TIBC Latest Ref Range: 250 - 450 mcg/dL (calc) 213  %SAT Latest Ref Range: 11 - 50 % (calc) 12  Ferritin Latest Ref Range: 10 - 232 ng/mL 5 (L)  Folate Latest Units: ng/mL 12.5   Results for TAWONA, FILSINGER (MRN [id number]) as of 11/26/2017 11:55  Ref. Range 10/15/2017 10:33  Alkaline phosphatase (APISO) Latest Ref Range: 33 - 130 U/L 90  CRP Latest Ref Range: <8.0 mg/L 4.7  Vitamin D, 25-Hydroxy Latest Ref Range: 30 - 100 ng/mL 6 (L)  Vitamin B12 Latest Ref Range: 200 - 1,100 pg/mL 388   Results for PAISLY, FINGERHUT (MRN [id number]) as of 11/26/2017 11:55  Ref. Range 10/15/2017 10:33  WBC Latest Ref Range: 3.8 - 10.8 Thousand/uL 6.3  WBC mixed population Latest Ref Range: 200 - 950 cells/uL 447  RBC Latest Ref Range: 3.80 - 5.10 Million/uL 4.44  Hemoglobin Latest Ref Range: 11.7 - 15.5 g/dL 24.4 (L)  HCT Latest Ref Range: 35.0 - 45.0 % 35.5  MCV Latest Ref Range: 80.0 - 100.0 fL 80.0  MCH Latest Ref Range: 27.0 - 33.0 pg 26.1 (L)  MCHC Latest Ref Range: 32.0 - 36.0 g/dL 01.0  RDW Latest Ref Range: 11.0 - 15.0 % 15.8 (H)  Platelets Latest Ref Range: 140 - 400 Thousand/uL 330  MPV Latest Ref Range: 7.5 - 12.5 fL 12.3    Results for TAHLOR, BERENGUER (MRN [id number]) as of 11/26/2017 11:55   Ref. Range 10/15/2017 10:33  TSH Latest Units: mIU/L 1.91  T4,Free(Direct) Latest Ref Range: 0.8 - 1.8 ng/dL 0.9  Results for EVADNA, DONAGHY (MRN [id number]) as of 11/26/2017 11:55  Ref. Range 10/15/2017 10:33  Anti Nuclear Antibody(ANA) Latest Ref Range: NEGATIVE  POSITIVE (A)  ANA Pattern 1 Unknown SPECKLED (A)  ANA Titer 1 Latest Units: titer 1:80 (H)  Cyclic Citrullin Peptide Ab Latest Units: UNITS <16  RA Latex Turbid. Latest Ref Range: <14 IU/mL <  14   

## 2017-11-26 NOTE — Progress Notes (Signed)
Chief Complaint  Patient presents with  . Follow-up   F/u  1. Reviewed labs ANA + 1:80 speckled c/o left knee pain and right 5th finger tip pain and right 5th toe pain intermittently  Will refer to rheumatology  2. Iron def will start iron and vit D def    Review of Systems  Eyes: Negative for blurred vision.  Respiratory: Negative for shortness of breath.   Cardiovascular: Negative for chest pain.  Gastrointestinal: Negative for heartburn.  Musculoskeletal: Positive for joint pain.   Past Medical History:  Diagnosis Date  . History of headache   . Prediabetes   . Vitamin D deficiency    Past Surgical History:  Procedure Laterality Date  . CESAREAN SECTION  1994   Family History  Problem Relation Age of Onset  . Other Mother        blood clot in leg  . Drug abuse Brother   . Alzheimer's disease Maternal Grandmother   . Breast cancer Neg Hx    Social History   Socioeconomic History  . Marital status: Legally Separated    Spouse name: Not on file  . Number of children: 4  . Years of education: College  . Highest education level: Not on file  Occupational History    Employer: OTHER    Comment: [employer]  Social Needs  . Financial resource strain: Not on file  . Food insecurity:    Worry: Not on file    Inability: Not on file  . Transportation needs:    Medical: Not on file    Non-medical: Not on file  Tobacco Use  . Smoking status: Never Smoker  . Smokeless tobacco: Never Used  Substance and Sexual Activity  . Alcohol use: Yes    Alcohol/week: 0.0 oz    Comment: occasionally  . Drug use: No  . Sexual activity: Yes    Birth control/protection: None  Lifestyle  . Physical activity:    Days per week: Not on file    Minutes per session: Not on file  . Stress: Not on file  Relationships  . Social connections:    Talks on phone: Not on file    Gets together: Not on file    Attends religious service: Not on file    Active member of club or  organization: Not on file    Attends meetings of clubs or organizations: Not on file    Relationship status: Not on file  . Intimate partner violence:    Fear of current or ex partner: Not on file    Emotionally abused: Not on file    Physically abused: Not on file    Forced sexual activity: Not on file  Other Topics Concern  . Not on file  Social History Narrative   Patient lives at home with family    Husband and 4 children, and mother-in-Law    Caffeine- 1 cup daily    Exercise- walking    Works at [employer]  . Cholecalciferol 50000 units capsule Take 1 capsule (50,000 Units total) by mouth once a week.   No Known Allergies Recent Results (from the past 2160 hour(s))  Vitamin D (25 hydroxy)     Status: Abnormal   Collection Time: 10/15/17 10:33 AM  Result Value Ref Range   Vit D, 25-Hydroxy 6 (L) 30 - 100 ng/mL    Comment: Vitamin D Status         25-OH  Vitamin D: . Deficiency:                    <20 ng/mL Insufficiency:             20 - 29 ng/mL Optimal:                 > or = 30 ng/mL . For 25-OH Vitamin D testing on patients on  D2-supplementation and patients for whom quantitation  of D2 and D3 fractions is required, the QuestAssureD(TM) 25-OH VIT D, (D2,D3), LC/MS/MS is recommended: order  code [phone] (patients >20yr). . For more information on this test, go to: http://education.questdiagnostics.com/faq/FAQ163 (This link is being provided for  informational/educational purposes only.)   Rheumatoid Factor     Status: None   Collection Time: 10/15/17 10:33 AM  Result Value Ref Range   Rhuematoid fact SerPl-aCnc <14 <14 IU/mL  Iron, TIBC and Ferritin Panel     Status: Abnormal   Collection Time: 10/15/17 10:33 AM  Result Value Ref Range   Iron 54 45 - 160 mcg/dL   TIBC 435 250 - 450 mcg/dL (calc)   %SAT 12 11 - 50 % (calc)   Ferritin 5 (L) 10 - 2604ng/mL  Cyclic citrul peptide antibody, IgG     Status: None   Collection Time:  10/15/17 10:33 AM  Result Value Ref Range   Cyclic Citrullin Peptide Ab <16 UNITS    Comment: Reference Range Negative:            <20 Weak Positive:       20-39 Moderate Positive:   40-59 Strong Positive:     >59 .   Antinuclear Antib (ANA)     Status: Abnormal   Collection Time: 10/15/17 10:33 AM  Result Value Ref Range   Anti Nuclear Antibody(ANA) POSITIVE (A) NEGATIVE    Comment: ANA IFA is a first line screen for detecting the presence of up to approximately 150 autoantibodies in various autoimmune diseases. A positive ANA IFA result is suggestive of autoimmune disease and reflexes to titer and pattern. Further laboratory testing may be considered if clinically indicated. . Visit Physician FAQs for interpretation of all antibodies in the [place], prevalence, and association with diseases at http://education.[facility].com/ fVWU/JWJ191.   B12     Status: None   Collection Time: 10/15/17 10:33 AM  Result Value Ref Range   Vitamin B-12 388 200 - 1,100 pg/mL    Comment: . Please Note: Although the reference range for vitamin B12 is [phone] pg/mL, it has been reported that between 5 and 10% of patients with values between 200 and 400 pg/mL may experience neuropsychiatric and hematologic abnormalities due to occult B12 deficiency; less than 1% of patients with values above 400 pg/mL will have symptoms. .   CBC w/Diff     Status: Abnormal   Collection Time: 10/15/17 10:33 AM  Result Value Ref Range   WBC 6.3 3.8 - 10.8 Thousand/uL   RBC 4.44 3.80 - 5.10 Million/uL   Hemoglobin 11.6 (L) 11.7 - 15.5 g/dL   HCT 35.5 35.0 - 45.0 %   MCV 80.0 80.0 - 100.0 fL   MCH 26.1 (L) 27.0 - 33.0 pg   MCHC 32.7 32.0 - 36.0 g/dL   RDW 15.8 (H) 11.0 - 15.0 %   Platelets 330 140 - 400 Thousand/uL   MPV 12.3 7.5 - 12.5 fL   Neutro Abs 3,635 1,500 - 7,800 cells/uL   Lymphs Abs 1,928 850 - 3,900  cells/uL   WBC mixed population 447 200 - 950 cells/uL   Eosinophils Absolute 202 15  - 500 cells/uL   Basophils Absolute 88 0 - 200 cells/uL   Neutrophils Relative % 57.7 %   Total Lymphocyte 30.6 %   Monocytes Relative 7.1 %   Eosinophils Relative 3.2 %   Basophils Relative 1.4 %  Comprehensive metabolic panel     Status: Abnormal   Collection Time: 10/15/17 10:33 AM  Result Value Ref Range   Glucose, Bld 100 (H) 65 - 99 mg/dL    Comment: .            Fasting reference interval . For someone without known diabetes, a glucose value between 100 and 125 mg/dL is consistent with prediabetes and should be confirmed with a follow-up test. .    BUN 17 7 - 25 mg/dL   Creat 0.54 0.50 - 1.05 mg/dL    Comment: For patients >36 years of age, the reference limit for Creatinine is approximately 13% higher for people identified as African-American. .    BUN/Creatinine Ratio NOT APPLICABLE 6 - 22 (calc)   Sodium 140 135 - 146 mmol/L   Potassium 4.3 3.5 - 5.3 mmol/L   Chloride 105 98 - 110 mmol/L   CO2 25 20 - 32 mmol/L   Calcium 9.1 8.6 - 10.4 mg/dL   Total Protein 7.3 6.1 - 8.1 g/dL   Albumin 4.1 3.6 - 5.1 g/dL   Globulin 3.2 1.9 - 3.7 g/dL (calc)   AG Ratio 1.3 1.0 - 2.5 (calc)   Total Bilirubin 0.4 0.2 - 1.2 mg/dL   Alkaline phosphatase (APISO) 90 33 - 130 U/L   AST 16 10 - 35 U/L   ALT 13 6 - 29 U/L  C-reactive protein     Status: None   Collection Time: 10/15/17 10:33 AM  Result Value Ref Range   CRP 4.7 <8.0 mg/L  Folate     Status: None   Collection Time: 10/15/17 10:33 AM  Result Value Ref Range   Folate 12.5 ng/mL    Comment:                            Reference Range                            Low:           <3.4                            Borderline:    3.4-5.4                            Normal:        >5.4 .   Sedimentation rate     Status: None   Collection Time: 10/15/17 10:33 AM  Result Value Ref Range   Sed Rate 14 0 - 20 mm/h  Uric acid     Status: None   Collection Time: 10/15/17 10:33 AM  Result Value Ref Range   Uric Acid, Serum 3.3  2.5 - 7.0 mg/dL    Comment: Therapeutic target for gout patients: <6.0 mg/dL .   TSH     Status: None   Collection Time: 10/15/17 10:33 AM  Result Value Ref Range   TSH 1.91 mIU/L    Comment:  Reference Range .           > or = 20 Years  0.40-4.50 .                Pregnancy Ranges           First trimester    0.26-2.66           Second trimester   0.55-2.73           Third trimester    0.43-2.91   T4, free     Status: None   Collection Time: 10/15/17 10:33 AM  Result Value Ref Range   Free T4 0.9 0.8 - 1.8 ng/dL  Anti-nuclear ab-titer (ANA titer)     Status: Abnormal   Collection Time: 10/15/17 10:33 AM  Result Value Ref Range   ANA Pattern 1 SPECKLED (A)     Comment: Speckled pattern is associated with mixed connective tissue disease (MCTD), systemic lupus erythematosus (SLE), Sjogren's syndrome, dermatomyositis, and systemic sclerosis/polymyositis overlap.    ANA Titer 1 1:80 (H) titer    Comment: A low level ANA titer may be present in pre-clinical autoimmune diseases and normal individuals.                 Reference Range                 <1:40        Negative                 1:40-1:80    Low Antibody Level                 >1:80        Elevated Antibody Level .    Objective  Body mass index is 32.2 kg/m. Wt Readings from Last 3 Encounters:  11/26/17 170 lb 6.4 oz (77.3 kg)  10/13/17 168 lb 3.2 oz (76.3 kg)  01/30/17 163 lb 6.4 oz (74.1 kg)   Temp Readings from Last 3 Encounters:  11/26/17 98.7 F (37.1 C) (Oral)  10/13/17 98.4 F (36.9 C) (Oral)  01/30/17 98.2 F (36.8 C) (Oral)   BP Readings from Last 3 Encounters:  11/26/17 116/68  10/13/17 120/60  01/30/17 128/78   Pulse Readings from Last 3 Encounters:  11/26/17 79  10/13/17 78  01/30/17 83    Physical Exam  Constitutional: She is oriented to person, place, and time. Vital signs are normal. She appears well-developed and well-nourished. She is cooperative.  HENT:  Head:  Normocephalic and atraumatic.  Mouth/Throat: Oropharynx is clear and moist and mucous membranes are normal.  Eyes: Pupils are equal, round, and reactive to light. Conjunctivae are normal.  Cardiovascular: Normal rate, regular rhythm and normal heart sounds.  Pulmonary/Chest: Effort normal and breath sounds normal.  Musculoskeletal:       Left knee: No tenderness found.       Arms: +crepitus left knee   Neurological: She is alert and oriented to person, place, and time. Gait normal.  Skin: Skin is warm, dry and intact.  Psychiatric: She has a normal mood and affect. Her speech is normal and behavior is normal. Judgment and thought content normal. Cognition and memory are normal.  Nursing note and vitals reviewed.   Assessment   1. Arthralgia (see HPI) with +ANA 1:80 speckled 2. Vitamin d def  3. Iron def  4. HM Plan   1. Refer to rheumatology  Prn Tylenol for pain  2. D3 x 6 months repeat level in  6 months  3. Iron 325 mg qd  4 . Will disc flu, Tdap in future Consider testing hep B/MMR Pap 08/23/16 negative no comment on HPV Will disc colonoscopy at f/u  Mammogram 01/17/17 negative   Provider: Dr. Olivia Mackie McLean-Scocuzza-Internal Medicine

## 2017-11-26 NOTE — Progress Notes (Signed)
Pre visit review using our clinic review tool, if applicable. No additional management support is needed unless otherwise documented below in the visit note. 

## 2017-11-27 NOTE — Progress Notes (Signed)
It was just sent on 7/15. The providers at Rheumatology review the notes and lets their schedulers know when/where to schedule them. They will call her once it is approved.

## 2017-12-03 ENCOUNTER — Telehealth: Payer: Self-pay

## 2017-12-03 NOTE — Telephone Encounter (Signed)
Marchelle Folksmanda from [facility] called and stated that they have tried to contact patient about referral and was not able to leave any messages due to voicemail being full. FYI

## 2018-01-13 ENCOUNTER — Encounter: Payer: Self-pay | Admitting: Internal Medicine

## 2018-02-02 ENCOUNTER — Telehealth: Payer: Self-pay | Admitting: Internal Medicine

## 2018-02-02 NOTE — Telephone Encounter (Signed)
Referral request

## 2018-02-02 NOTE — Telephone Encounter (Signed)
Copied from CRM [id number]. Topic: Quick Communication - See Telephone Encounter >> Feb 02, 2018  4:24 PM Jens SomMedley, Jennifer A wrote: CRM for notification. See Telephone encounter for: 02/02/18. Patient is requesting a referral for bone density sent [facility] [address] [place] [id number] [phone]

## 2018-02-04 NOTE — Telephone Encounter (Signed)
Unable to leave message for patient to return call back due to VM box being full. PEC may obtain information.

## 2018-02-04 NOTE — Telephone Encounter (Signed)
Why is patient requesting bone density?

## 2018-02-11 ENCOUNTER — Other Ambulatory Visit: Payer: Self-pay | Admitting: Internal Medicine

## 2018-02-11 ENCOUNTER — Ambulatory Visit
Admission: RE | Admit: 2018-02-11 | Discharge: 2018-02-11 | Disposition: A | Discharge status: Home / Self Care | Payer: BLUE CROSS/BLUE SHIELD | Source: Ambulatory Visit | Attending: Internal Medicine | Admitting: Internal Medicine

## 2018-02-11 ENCOUNTER — Encounter (HOSPITAL_COMMUNITY): Payer: Self-pay

## 2018-02-11 DIAGNOSIS — Z1231 Encounter for screening mammogram for malignant neoplasm of breast: Secondary | ICD-10-CM

## 2018-02-11 DIAGNOSIS — N6310 Unspecified lump in the right breast, unspecified quadrant: Secondary | ICD-10-CM

## 2018-02-11 DIAGNOSIS — R928 Other abnormal and inconclusive findings on diagnostic imaging of breast: Secondary | ICD-10-CM

## 2018-02-16 ENCOUNTER — Telehealth: Payer: Self-pay

## 2018-02-16 NOTE — Telephone Encounter (Signed)
Copied from CRM [id number]. Topic: Referral - Request >> Feb 16, 2018  3:08 PM Lorayne Bender wrote: Reason for CRM:   Pt states she needs a referral for her Bone Density scan sent to [facility]. Pt can be reached at [phone]

## 2018-03-03 ENCOUNTER — Ambulatory Visit: Payer: BLUE CROSS/BLUE SHIELD | Admitting: Obstetrics and Gynecology

## 2018-03-09 ENCOUNTER — Ambulatory Visit: Payer: BLUE CROSS/BLUE SHIELD

## 2018-03-09 ENCOUNTER — Other Ambulatory Visit: Payer: BLUE CROSS/BLUE SHIELD

## 2018-03-13 ENCOUNTER — Ambulatory Visit
Admission: RE | Admit: 2018-03-13 | Discharge: 2018-03-13 | Disposition: A | Discharge status: Home / Self Care | Payer: BLUE CROSS/BLUE SHIELD | Source: Ambulatory Visit | Attending: Internal Medicine | Admitting: Internal Medicine

## 2018-03-13 DIAGNOSIS — N6310 Unspecified lump in the right breast, unspecified quadrant: Secondary | ICD-10-CM | POA: Diagnosis not present

## 2018-03-13 DIAGNOSIS — R928 Other abnormal and inconclusive findings on diagnostic imaging of breast: Secondary | ICD-10-CM | POA: Insufficient documentation

## 2018-03-13 DIAGNOSIS — N6001 Solitary cyst of right breast: Secondary | ICD-10-CM | POA: Diagnosis not present

## 2018-03-24 ENCOUNTER — Ambulatory Visit: Payer: BLUE CROSS/BLUE SHIELD | Admitting: Obstetrics and Gynecology

## 2018-03-31 ENCOUNTER — Ambulatory Visit: Payer: BLUE CROSS/BLUE SHIELD | Admitting: Internal Medicine

## 2018-04-16 ENCOUNTER — Ambulatory Visit: Payer: BLUE CROSS/BLUE SHIELD | Admitting: Internal Medicine

## 2018-04-28 ENCOUNTER — Ambulatory Visit: Payer: BLUE CROSS/BLUE SHIELD | Admitting: Obstetrics and Gynecology

## 2018-04-29 ENCOUNTER — Other Ambulatory Visit: Payer: Self-pay | Admitting: Internal Medicine

## 2018-04-29 DIAGNOSIS — E559 Vitamin D deficiency, unspecified: Secondary | ICD-10-CM

## 2018-04-29 MED ORDER — CHOLECALCIFEROL 1.25 MG (50000 UT) PO CAPS: 50000.0000 [IU] | ORAL_CAPSULE | ORAL | 1 refills | Status: DC

## 2018-07-10 ENCOUNTER — Telehealth: Payer: Self-pay | Admitting: *Deleted

## 2018-07-10 NOTE — Telephone Encounter (Signed)
Copied from CRM [id number]. Topic: Referral - Status >> Jul 10, 2018 11:12 AM Fanny Bien wrote: Reason for CRM: dawn calling with [facility] rheumatology called and stated that she does not have a current phone number for pt. Please advise

## 2018-07-10 NOTE — Telephone Encounter (Signed)
Rheumatology closed until Monday.

## 2018-12-06 NOTE — Progress Notes (Signed)
PCP: McLean-Scocuzza, Pasty Spillersracy N, MD   Chief Complaint  Patient presents with   Gynecologic Exam    HPI:      [name] is a 53 y.o. [phone] who LMP was No LMP recorded. (Menstrual status: Perimenopausal)., presents today for her annual examination.  Her menses areQ1-3 months due to perimenopause, lasting 6-7 days.  Dysmenorrhea none. She does not have intermenstrual bleeding.She does not have vasomotor sx.   Sex activity: single partner, contraception - perimenopause. She does not have vaginal dryness.  Last Pap: August 22, 2016  Results were: no abnormalities /neg HPV DNA 2015 Hx of STDs: none  Last mammogram: March 13, 2018  Results were: normal--routine follow-up in 12 months There is no FH of breast cancer. There is no FH of ovarian cancer. The patient does not do self-breast exams.  Colonoscopy: never  Tobacco use: The patient denies current or previous tobacco use. Alcohol use: none Exercise: moderately active  She does get adequate calcium and Vitamin D in her diet.  No recent labs. Hx of Vit D deficiency, take Rx Vit D.   Past Medical History:  Diagnosis Date   Breast cyst 04/15/2014   simple cyst   History of headache    History of mammogram    Birad 2 mammogram done prior to 07/20/15 appt   Prediabetes    Vitamin D deficiency     Past Surgical History:  Procedure Laterality Date   CESAREAN SECTION  1994    Family History  Problem Relation Age of Onset   Other Mother        blood clot in leg   Drug abuse Brother    Alzheimer's disease Maternal Grandmother    Breast cancer Neg Hx     Social History   Socioeconomic History   Marital status: Legally Separated    Spouse name: Not on file   Number of children: 4   Years of education: College   Highest education level: Not on file  Occupational History    Employer: OTHER    Comment: [employer]  Social Needs   Financial resource strain: Not on file   Food  insecurity    Worry: Not on file    Inability: Not on file   Transportation needs    Medical: Not on file    Non-medical: Not on file  Tobacco Use   Smoking status: Never Smoker   Smokeless tobacco: Never Used  Substance and Sexual Activity   Alcohol use: Yes    Alcohol/week: 0.0 standard drinks    Comment: occasionally   Drug use: No   Sexual activity: Yes    Birth control/protection: None  Lifestyle   Physical activity    Days per week: Not on file    Minutes per session: Not on file   Stress: Not on file  Relationships   Social connections    Talks on phone: Not on file    Gets together: Not on file    Attends religious service: Not on file    Active member of club or organization: Not on file    Attends meetings of clubs or organizations: Not on file    Relationship status: Not on file   Intimate partner violence    Fear of current or ex partner: Not on file    Emotionally abused: Not on file    Physically abused: Not on file    Forced sexual activity: Not on file  Other Topics Concern  Not on file  Social History Narrative   Patient lives at home with family husband    Husband and 4 children, and mother-in-Law    From [place]   1 son going to [facility] Fall 2019 to play football   Caffeine- 1 cup daily    Exercise- walking    Works at [employer] Medications Prior to Visit  Medication Sig Dispense Refill   Cholecalciferol 1.25 MG (50000 UT) capsule Take 1 capsule (50,000 Units total) by mouth once a week. 13 capsule 1   ferrous sulfate 325 (65 FE) MG tablet Take 1 tablet (325 mg total) by mouth daily with breakfast. 90 tablet 3   No facility-administered medications prior to visit.      ROS:  Review of Systems  Constitutional: Negative for fatigue, fever and unexpected weight change.  Respiratory: Negative for cough, shortness of breath and wheezing.   Cardiovascular: Negative for chest pain, palpitations and leg  swelling.  Gastrointestinal: Negative for blood in stool, constipation, diarrhea, nausea and vomiting.  Endocrine: Negative for cold intolerance, heat intolerance and polyuria.  Genitourinary: Negative for dyspareunia, dysuria, flank pain, frequency, genital sores, hematuria, menstrual problem, pelvic pain, urgency, vaginal bleeding, vaginal discharge and vaginal pain.  Musculoskeletal: Negative for back pain, joint swelling and myalgias.  Skin: Negative for rash.  Neurological: Negative for dizziness, syncope, light-headedness, numbness and headaches.  Hematological: Negative for adenopathy.  Psychiatric/Behavioral: Positive for agitation. Negative for confusion, sleep disturbance and suicidal ideas. The patient is not nervous/anxious.   BREAST: No symptoms    Objective: BP 100/60    Ht 5\' 1"  (1.549 m)    Wt 175 lb 12.8 oz (79.7 kg)    BMI 33.22 kg/m    Physical Exam Constitutional:      Appearance: She is well-developed.  Genitourinary:     Vulva, vagina, cervix, uterus, right adnexa, left adnexa and rectum normal.     No vulval lesion or tenderness noted.     No vaginal discharge, erythema or tenderness.     No cervical polyp.     Uterus is not enlarged or tender.     No right or left adnexal mass present.     Right adnexa not tender.     Left adnexa not tender.  Rectum:     Guaiac result negative.     No anal fissure or tenderness.  Neck:     Musculoskeletal: Normal range of motion.     Thyroid: No thyromegaly.  Cardiovascular:     Rate and Rhythm: Normal rate and regular rhythm.     Heart sounds: Normal heart sounds. No murmur.  Pulmonary:     Effort: Pulmonary effort is normal.     Breath sounds: Normal breath sounds.  Chest:     Breasts:        Right: No mass, nipple discharge, skin change or tenderness.        Left: No mass, nipple discharge, skin change or tenderness.  Abdominal:     Palpations: Abdomen is soft.     Tenderness: There is no abdominal  tenderness. There is no guarding.  Musculoskeletal: Normal range of motion.  Neurological:     General: No focal deficit present.     Mental Status: She is alert and oriented to person, place, and time.     Cranial Nerves: No cranial nerve deficit.  Skin:    General: Skin is warm and dry.  Psychiatric:  Mood and Affect: Mood normal.        Behavior: Behavior normal.        Thought Content: Thought content normal.        Judgment: Judgment normal.  Vitals signs reviewed.     Results: Results for orders placed or performed in visit on 12/07/18 (from the past 24 hour(s))  POCT Occult Blood Stool     Status: Normal   Collection Time: 12/07/18  9:00 AM  Result Value Ref Range   Fecal Occult Blood, POC Negative Negative   Card #1 Date     Card #2 Fecal Occult Blod, POC     Card #2 Date     Card #3 Fecal Occult Blood, POC     Card #3 Date      Assessment/Plan:  Encounter for annual routine gynecological examination -   Cervical cancer screening - Plan: Cytology - PAP,   Screening for HPV (human papillomavirus) - Plan: Cytology - PAP,   Screening for breast cancer - Plan: MM 3D SCREEN BREAST BILATERAL, Pt to sched mammo  Perimenopause--F/u prn DUB.  Screening for colon cancer - Plan: POCT Occult Blood Stool, Ambulatory referral to Gastroenterology, Neg FOBT. Refer to GI for scr colonoscopy due to age.  Blood tests for routine general physical examination - Plan: Comprehensive metabolic panel, Lipid panel, Hemoglobin A1c,   Screening cholesterol level - Plan: Lipid panel,   Screening for diabetes mellitus - Plan: Hemoglobin A1c,   BMI 33.0-33.9,adult - Plan: Comprehensive metabolic panel, Lipid panel, Hemoglobin A1c,           GYN counsel breast self exam, mammography screening, menopause, adequate intake of calcium and vitamin D, diet and exercise    F/U  Return in about 1 year (around 12/07/2019).  Jamaya Sleeth B. Rosi Secrist, PA-C 12/07/2018 9:01 AM

## 2018-12-07 ENCOUNTER — Ambulatory Visit (INDEPENDENT_AMBULATORY_CARE_PROVIDER_SITE_OTHER): Payer: BC Managed Care – PPO | Admitting: Obstetrics and Gynecology

## 2018-12-07 ENCOUNTER — Other Ambulatory Visit: Payer: Self-pay

## 2018-12-07 ENCOUNTER — Encounter: Payer: Self-pay | Admitting: Obstetrics and Gynecology

## 2018-12-07 ENCOUNTER — Other Ambulatory Visit (HOSPITAL_COMMUNITY)
Admission: RE | Admit: 2018-12-07 | Discharge: 2018-12-07 | Disposition: A | Discharge status: Home / Self Care | Payer: BC Managed Care – PPO | Source: Ambulatory Visit | Attending: Obstetrics and Gynecology | Admitting: Obstetrics and Gynecology

## 2018-12-07 VITALS — BP 100/60 | Ht 61.0 in | Wt 175.8 lb

## 2018-12-07 DIAGNOSIS — Z01419 Encounter for gynecological examination (general) (routine) without abnormal findings: Secondary | ICD-10-CM | POA: Diagnosis not present

## 2018-12-07 DIAGNOSIS — Z124 Encounter for screening for malignant neoplasm of cervix: Secondary | ICD-10-CM | POA: Insufficient documentation

## 2018-12-07 DIAGNOSIS — Z6833 Body mass index (BMI) 33.0-33.9, adult: Secondary | ICD-10-CM

## 2018-12-07 DIAGNOSIS — Z0000 Encounter for general adult medical examination without abnormal findings: Secondary | ICD-10-CM | POA: Diagnosis not present

## 2018-12-07 DIAGNOSIS — Z1211 Encounter for screening for malignant neoplasm of colon: Secondary | ICD-10-CM

## 2018-12-07 DIAGNOSIS — Z131 Encounter for screening for diabetes mellitus: Secondary | ICD-10-CM

## 2018-12-07 DIAGNOSIS — Z1151 Encounter for screening for human papillomavirus (HPV): Secondary | ICD-10-CM | POA: Diagnosis not present

## 2018-12-07 DIAGNOSIS — Z13220 Encounter for screening for lipoid disorders: Secondary | ICD-10-CM | POA: Diagnosis not present

## 2018-12-07 DIAGNOSIS — Z1239 Encounter for other screening for malignant neoplasm of breast: Secondary | ICD-10-CM

## 2018-12-07 LAB — HEMOCCULT GUIAC POC 1CARD (OFFICE): Fecal Occult Blood, POC: NEGATIVE

## 2018-12-07 NOTE — Patient Instructions (Signed)
I value your feedback and entrusting us with your care. If you get a [place] patient survey, I would appreciate you taking the time to let us know about your experience today. Thank you!  [facility]: [phone]    

## 2018-12-08 ENCOUNTER — Ambulatory Visit (INDEPENDENT_AMBULATORY_CARE_PROVIDER_SITE_OTHER): Payer: BLUE CROSS/BLUE SHIELD | Admitting: Internal Medicine

## 2018-12-08 ENCOUNTER — Encounter: Payer: Self-pay | Admitting: Internal Medicine

## 2018-12-08 ENCOUNTER — Other Ambulatory Visit: Payer: Self-pay

## 2018-12-08 DIAGNOSIS — E559 Vitamin D deficiency, unspecified: Secondary | ICD-10-CM

## 2018-12-08 DIAGNOSIS — M25562 Pain in left knee: Secondary | ICD-10-CM

## 2018-12-08 DIAGNOSIS — Z0000 Encounter for general adult medical examination without abnormal findings: Secondary | ICD-10-CM

## 2018-12-08 DIAGNOSIS — R768 Other specified abnormal immunological findings in serum: Secondary | ICD-10-CM

## 2018-12-08 DIAGNOSIS — Z0184 Encounter for antibody response examination: Secondary | ICD-10-CM

## 2018-12-08 DIAGNOSIS — Z1329 Encounter for screening for other suspected endocrine disorder: Secondary | ICD-10-CM

## 2018-12-08 DIAGNOSIS — G8929 Other chronic pain: Secondary | ICD-10-CM

## 2018-12-08 DIAGNOSIS — R7303 Prediabetes: Secondary | ICD-10-CM

## 2018-12-08 DIAGNOSIS — Z1389 Encounter for screening for other disorder: Secondary | ICD-10-CM

## 2018-12-08 DIAGNOSIS — R7689 Other specified abnormal immunological findings in serum: Secondary | ICD-10-CM

## 2018-12-08 DIAGNOSIS — E611 Iron deficiency: Secondary | ICD-10-CM

## 2018-12-08 DIAGNOSIS — Z1159 Encounter for screening for other viral diseases: Secondary | ICD-10-CM

## 2018-12-08 LAB — COMPREHENSIVE METABOLIC PANEL
ALT: 16 IU/L (ref 0–32)
AST: 14 IU/L (ref 0–40)
Albumin/Globulin Ratio: 1.3 (ref 1.2–2.2)
Albumin: 3.8 g/dL (ref 3.8–4.9)
Alkaline Phosphatase: 76 IU/L (ref 39–117)
BUN/Creatinine Ratio: 35 — ABNORMAL HIGH (ref 9–23)
BUN: 18 mg/dL (ref 6–24)
Bilirubin Total: 0.3 mg/dL (ref 0.0–1.2)
CO2: 22 mmol/L (ref 20–29)
Calcium: 8.7 mg/dL (ref 8.7–10.2)
Chloride: 104 mmol/L (ref 96–106)
Creatinine, Ser: 0.51 mg/dL — ABNORMAL LOW (ref 0.57–1.00)
GFR calc Af Amer: 129 mL/min/{1.73_m2} (ref >59)
GFR calc non Af Amer: 112 mL/min/{1.73_m2} (ref >59)
Globulin, Total: 2.9 g/dL (ref 1.5–4.5)
Glucose: 97 mg/dL (ref 65–99)
Potassium: 4.2 mmol/L (ref 3.5–5.2)
Sodium: 141 mmol/L (ref 134–144)
Total Protein: 6.7 g/dL (ref 6.0–8.5)

## 2018-12-08 LAB — LIPID PANEL
Chol/HDL Ratio: 4.3 ratio (ref 0.0–4.4)
Cholesterol, Total: 185 mg/dL (ref 100–199)
HDL: 43 mg/dL (ref >39)
LDL Calculated: 119 mg/dL — ABNORMAL HIGH (ref 0–99)
Triglycerides: 116 mg/dL (ref 0–149)
VLDL Cholesterol Cal: 23 mg/dL (ref 5–40)

## 2018-12-08 LAB — HEMOGLOBIN A1C
Est. average glucose Bld gHb Est-mCnc: 117 mg/dL
Hgb A1c MFr Bld: 5.7 % — ABNORMAL HIGH (ref 4.8–5.6)

## 2018-12-08 NOTE — Progress Notes (Signed)
Virtual Visit via Video Note  I connected with Adrienne Wallace  on 12/08/18 at  1:10 PM EDT by a video enabled telemedicine application and verified that I am speaking with the correct person using two identifiers.  Location patient: home Location provider:work  Persons participating in the virtual visit: patient, provider  I discussed the limitations of evaluation and management by telemedicine and the availability of in person appointments. The patient expressed understanding and agreed to proceed.   HPI: 1. Chronic left knee pain x 6 months and clicking noise unable to walk her dog due to pain and unable to bend nothing tried other than knee brace  2. + ANA never saw rheumatology in the past  3. Prediabetes A1C 5.7 yesterday with ob/gyn she reports wt is 170-175 exercise limited due to #1  4. She wants Tdap and asks about shingrix    ROS: See pertinent positives and negatives per HPI.  Past Medical History:  Diagnosis Date  . Breast cyst 04/15/2014   simple cyst  . History of headache   . History of mammogram    Birad 2 mammogram done prior to 07/20/15 appt  . Prediabetes   . Vitamin D deficiency     Past Surgical History:  Procedure Laterality Date  . CESAREAN SECTION  1994    Family History  Problem Relation Age of Onset  . Other Mother        blood clot in leg  . Drug abuse Brother   . Alzheimer's disease Maternal Grandmother   . Breast cancer Neg Hx     SOCIAL HX:  Patient lives at home with family husband  Husband and 4 children, and mother-in-Law  From [place] 1 son going to [facility] Fall 2019 to play football Caffeine- 1 cup daily  Exercise- walking  Works at [employer]     Current Outpatient Medications:  .  Cholecalciferol 1.25 MG (50000 UT) capsule, Take 1 capsule (50,000 Units total) by mouth once a week., Disp: 13 capsule, Rfl: 1 .  ferrous sulfate 325 (65 FE) MG tablet, Take 1 tablet (325 mg total) by mouth daily with breakfast., Disp:  90 tablet, Rfl: 3  EXAM:  VITALS per patient if applicable:  GENERAL: alert, oriented, appears well and in no acute distress  HEENT: atraumatic, conjunttiva clear, no obvious abnormalities on inspection of external nose and ears  NECK: normal movements of the head and neck  LUNGS: on inspection no signs of respiratory distress, breathing rate appears normal, no obvious gross SOB, gasping or wheezing  CV: no obvious cyanosis  MS: moves all visible extremities without noticeable abnormality  PSYCH/NEURO: pleasant and cooperative, no obvious depression or anxiety, speech and thought processing grossly intact  ASSESSMENT AND PLAN:  Discussed the following assessment and plan:  Chronic pain of left knee - Plan: DG Knee Complete 4 Views Left, Antinuclear Antib (ANA), CBC w/Diff, Refer to [facility] ortho if not better Dr .Marry Guan urgently   Iron deficiency - Plan: IBC + Ferritin, CBC   Vitamin D deficiency - Plan: Vitamin D (25 hydroxy)  Elevated antinuclear antibody (ANA) level - Plan: Antinuclear Antib (ANA)  Prediabetes -A1C 5.7 rec healthy diet and exercise   HM-physical at f/u  Will disc flu, Tdap sch 12/11/2018 Disc shingrix given info to read about Consider testing hep B/MMR will check  Pap 08/23/16 negative no comment on HPV -pap had 12/07/18  Will disc colonoscopy at f/u  Mammogram 01/17/17 negative pt to call to schedule ordered  by ob/gyn     I discussed the assessment and treatment plan with the patient. The patient was provided an opportunity to ask questions and all were answered. The patient agreed with the plan and demonstrated an understanding of the instructions.   The patient was advised to call back or seek an in-person evaluation if the symptoms worsen or if the condition fails to improve as anticipated.  Time spent 15 minutes  Delorise Jackson, MD

## 2018-12-08 NOTE — Patient Instructions (Signed)
Recombinant Zoster (Shingles) Vaccine: What You Need to Know 1. Why get vaccinated? Recombinant zoster (shingles) vaccine can prevent shingles. Shingles (also called herpes zoster, or just zoster) is a painful skin rash, usually with blisters. In addition to the rash, shingles can cause fever, headache, chills, or upset stomach. More rarely, shingles can lead to pneumonia, hearing problems, blindness, brain inflammation (encephalitis), or death. The most common complication of shingles is long-term nerve pain called postherpetic neuralgia (PHN). PHN occurs in the areas where the shingles rash was, even after the rash clears up. It can last for months or years after the rash goes away. The pain from PHN can be severe and debilitating. About 10 to 18% of people who get shingles will experience PHN. The risk of PHN increases with age. An older adult with shingles is more likely to develop PHN and have longer lasting and more severe pain than a younger person with shingles. Shingles is caused by the varicella zoster virus, the same virus that causes chickenpox. After you have chickenpox, the virus stays in your body and can cause shingles later in life. Shingles cannot be passed from one person to another, but the virus that causes shingles can spread and cause chickenpox in someone who had never had chickenpox or received chickenpox vaccine. 2. Recombinant shingles vaccine Recombinant shingles vaccine provides strong protection against shingles. By preventing shingles, recombinant shingles vaccine also protects against PHN. Recombinant shingles vaccine is the preferred vaccine for the prevention of shingles. However, a different vaccine, live shingles vaccine, may be used in some circumstances. The recombinant shingles vaccine is recommended for adults 50 years and older without serious immune problems. It is given as a two-dose series. This vaccine is also recommended for people who have already gotten  another type of shingles vaccine, the live shingles vaccine. There is no live virus in this vaccine. Shingles vaccine may be given at the same time as other vaccines. 3. Talk with your health care provider Tell your vaccine provider if the person getting the vaccine:  Has had an allergic reaction after a previous dose of recombinant shingles vaccine, or has any severe, life-threatening allergies.  Is pregnant or breastfeeding.  Is currently experiencing an episode of shingles. In some cases, your health care provider may decide to postpone shingles vaccination to a future visit. People with minor illnesses, such as a cold, may be vaccinated. People who are moderately or severely ill should usually wait until they recover before getting recombinant shingles vaccine. Your health care provider can give you more information. 4. Risks of a vaccine reaction  A sore arm with mild or moderate pain is very common after recombinant shingles vaccine, affecting about 80% of vaccinated people. Redness and swelling can also happen at the site of the injection.  Tiredness, muscle pain, headache, shivering, fever, stomach pain, and nausea happen after vaccination in more than half of people who receive recombinant shingles vaccine. In clinical trials, about 1 out of 6 people who got recombinant zoster vaccine experienced side effects that prevented them from doing regular activities. Symptoms usually went away on their own in 2 to 3 days. You should still get the second dose of recombinant zoster vaccine even if you had one of these reactions after the first dose. People sometimes faint after medical procedures, including vaccination. Tell your provider if you feel dizzy or have vision changes or ringing in the ears. As with any medicine, there is a very remote chance of a vaccine causing  a severe allergic reaction, other serious injury, or death. 5. What if there is a serious problem? An allergic reaction  could occur after the vaccinated person leaves the clinic. If you see signs of a severe allergic reaction (hives, swelling of the face and throat, difficulty breathing, a fast heartbeat, dizziness, or weakness), call 9-1-1 and get the person to the nearest hospital. For other signs that concern you, call your health care provider. Adverse reactions should be reported to the Vaccine Adverse Event Reporting System (VAERS). Your health care provider will usually file this report, or you can do it yourself. Visit the VAERS website at www.vaers.SamedayNews.es or call 720-328-5244. VAERS is only for reporting reactions, and VAERS staff do not give medical advice. 6. How can I learn more?  Ask your health care provider.  Call your local or state health department.  Contact the Centers for Disease Control and Prevention (CDC): ? Call 830-711-5249 (1-800-CDC-INFO) or ? Visit CDC's website at http://hunter.com/ Vaccine Information Statement Recombinant Zoster Vaccine (03/11/2018) This information is not intended to replace advice given to you by your health care provider. Make sure you discuss any questions you have with your health care provider. Document Released: 07/09/2016 Document Revised: 08/18/2018 Document Reviewed: 12/03/2017 Elsevier Patient Education  [place] (Tetanus and Diphtheria): What You Need to Know 1. Why get vaccinated? Tetanus  and diphtheria are very serious diseases. They are rare in the [place] today, but people who do become infected often have severe complications. Td vaccine is used to protect adolescents and adults from both of these diseases. Both tetanus and diphtheria are infections caused by bacteria. Diphtheria spreads from person to person through coughing or sneezing. Tetanus-causing bacteria enter the body through cuts, scratches, or wounds. TETANUS (Lockjaw) causes painful muscle tightening and stiffness, usually all over the body.  It  can lead to tightening of muscles in the head and neck so you can't open your mouth, swallow, or sometimes even breathe. Tetanus kills about 1 out of every 10 people who are infected even after receiving the best medical care. DIPHTHERIA can cause a thick coating to form in the back of the throat.  It can lead to breathing problems, paralysis, heart failure, and death. Before vaccines, as many as 200,000 cases of diphtheria and hundreds of cases of tetanus were reported in the [place] each year. Since vaccination began, reports of cases for both diseases have dropped by about 99%. 2. Td vaccine Td vaccine can protect adolescents and adults from tetanus and diphtheria. Td is usually given as a booster dose every 10 years but it can also be given earlier after a severe and dirty wound or burn. Another vaccine, called Tdap, which protects against pertussis in addition to tetanus and diphtheria, is sometimes recommended instead of Td vaccine. Your doctor or the person giving you the vaccine can give you more information. Td may safely be given at the same time as other vaccines. 3. Some people should not get this vaccine  A person who has ever had a life-threatening allergic reaction after a previous dose of any tetanus or diphtheria containing vaccine, OR has a severe allergy to any part of this vaccine, should not get Td vaccine. Tell the person giving the vaccine about any severe allergies.  Talk to your doctor if you: ? had severe pain or swelling after any vaccine containing diphtheria or tetanus, ? ever had a condition called Guillain Barr Syndrome (GBS), ? aren't feeling well  on the day the shot is scheduled. 4. Risks of a vaccine reaction With any medicine, including vaccines, there is a chance of side effects. These are usually mild and go away on their own. Serious reactions are also possible but are rare. Most people who get Td vaccine do not have any problems with it. Mild  Problems following Td vaccine: (Did not interfere with activities)  Pain where the shot was given (about 8 people in 10)  Redness or swelling where the shot was given (about 1 person in 4)  Mild fever (rare)  Headache (about 1 person in 4)  Tiredness (about 1 person in 4) Moderate Problems following Td vaccine: (Interfered with activities, but did not require medical attention)  Fever over 102F (rare) Severe Problems following Td vaccine: (Unable to perform usual activities; required medical attention)  Swelling, severe pain, bleeding and/or redness in the arm where the shot was given (rare). Problems that could happen after any vaccine:  People sometimes faint after a medical procedure, including vaccination. Sitting or lying down for about 15 minutes can help prevent fainting, and injuries caused by a fall. Tell your doctor if you feel dizzy, or have vision changes or ringing in the ears.  Some people get severe pain in the shoulder and have difficulty moving the arm where a shot was given. This happens very rarely.  Any medication can cause a severe allergic reaction. Such reactions from a vaccine are very rare, estimated at fewer than 1 in a million doses, and would happen within a few minutes to a few hours after the vaccination. As with any medicine, there is a very remote chance of a vaccine causing a serious injury or death. The safety of vaccines is always being monitored. For more information, visit: http://floyd.org/www.cdc.gov/vaccinesafety/ 5. What if there is a serious reaction? What should I look for?  Look for anything that concerns you, such as signs of a severe allergic reaction, very high fever, or unusual behavior. Signs of a severe allergic reaction can include hives, swelling of the face and throat, difficulty breathing, a fast heartbeat, dizziness, and weakness. These would usually start a few minutes to a few hours after the vaccination. What should I do?  If you think it  is a severe allergic reaction or other emergency that can't wait, call 9-1-1 or get the person to the nearest hospital. Otherwise, call your doctor.  Afterward, the reaction should be reported to the Vaccine Adverse Event Reporting System (VAERS). Your doctor might file this report, or you can do it yourself through the VAERS web site at www.vaers.LAgents.nohhs.gov, or by calling 1-878-784-5403. VAERS does not give medical advice. 6. The National Vaccine Injury Compensation Program The [employer] Vaccine Injury Compensation Program (VICP) is a federal program that was created to compensate people who may have been injured by certain vaccines. Persons who believe they may have been injured by a vaccine can learn about the program and about filing a claim by calling 1-980-192-8401 or visiting the VICP website at SpiritualWord.atwww.hrsa.gov/vaccinecompensation. There is a time limit to file a claim for compensation. 7. How can I learn more?  Ask your doctor. He or she can give you the vaccine package insert or suggest other sources of information.  Call your local or state health department.  Contact the Centers for Disease Control and Prevention (CDC): ? Call 534-407-85631-551-078-2569 (1-800-CDC-INFO) ? Visit CDC's website at PicCapture.uywww.cdc.gov/vaccines Vaccine Information Statement Td Vaccine (08/22/15) This information is not intended to replace advice given to  you by your health care provider. Make sure you discuss any questions you have with your health care provider. Document Released: 02/24/2006 Document Revised: 12/15/2017 Document Reviewed: 12/15/2017 Elsevier Interactive Patient Education  [employer]2020 Elsevier Inc.

## 2018-12-09 LAB — CYTOLOGY - PAP
Diagnosis: NEGATIVE
HPV: NOT DETECTED

## 2018-12-11 ENCOUNTER — Other Ambulatory Visit: Payer: Self-pay

## 2018-12-11 ENCOUNTER — Ambulatory Visit: Payer: Self-pay

## 2018-12-16 ENCOUNTER — Telehealth: Payer: Self-pay

## 2018-12-16 NOTE — Telephone Encounter (Signed)
Copied from [place] [id number] [phone]. Topic: Referral - Request for Referral >> Dec 16, 2018  4:43 PM Alanda Slim E wrote: Has patient seen PCP for this complaint? Yes   *If NO, is insurance requiring patient see PCP for this issue before PCP can refer them? Referral for which specialty: orthopedic  Preferred provider/office: [facility] Reason for referral: Knee xray / Pt was advised that she needed an order from her provider

## 2018-12-16 NOTE — Telephone Encounter (Signed)
I ordered the Xray [facility] on [address] she can walk into do this Monday-Friday 8 to 5 ordered last week  Based on this will get [facility] referral in place or does she want to see Dr. Marry Guan

## 2018-12-17 NOTE — Telephone Encounter (Signed)
Called pt.  No answer.  Unable to leave message since voice mailbox was full.  Sent pt a message through [place].

## 2018-12-21 ENCOUNTER — Ambulatory Visit
Admission: RE | Admit: 2018-12-21 | Discharge: 2018-12-21 | Disposition: A | Discharge status: Home / Self Care | Payer: BC Managed Care – PPO | Source: Ambulatory Visit | Attending: Internal Medicine | Admitting: Internal Medicine

## 2018-12-21 ENCOUNTER — Ambulatory Visit
Admission: RE | Admit: 2018-12-21 | Discharge: 2018-12-21 | Disposition: A | Discharge status: Home / Self Care | Payer: BC Managed Care – PPO | Attending: Internal Medicine | Admitting: Internal Medicine

## 2018-12-21 ENCOUNTER — Other Ambulatory Visit: Payer: Self-pay | Admitting: Internal Medicine

## 2018-12-21 ENCOUNTER — Other Ambulatory Visit: Payer: Self-pay

## 2018-12-21 ENCOUNTER — Ambulatory Visit: Admission: RE | Admit: 2018-12-21 | Payer: BC Managed Care – PPO | Source: Home / Self Care

## 2018-12-21 DIAGNOSIS — M25562 Pain in left knee: Secondary | ICD-10-CM

## 2018-12-21 DIAGNOSIS — G8929 Other chronic pain: Secondary | ICD-10-CM

## 2018-12-21 DIAGNOSIS — M1712 Unilateral primary osteoarthritis, left knee: Secondary | ICD-10-CM | POA: Diagnosis not present

## 2018-12-23 ENCOUNTER — Encounter: Payer: Self-pay | Admitting: *Deleted

## 2018-12-24 ENCOUNTER — Ambulatory Visit (INDEPENDENT_AMBULATORY_CARE_PROVIDER_SITE_OTHER): Payer: BC Managed Care – PPO

## 2018-12-24 ENCOUNTER — Other Ambulatory Visit: Payer: Self-pay

## 2018-12-24 ENCOUNTER — Other Ambulatory Visit (INDEPENDENT_AMBULATORY_CARE_PROVIDER_SITE_OTHER): Payer: BC Managed Care – PPO

## 2018-12-24 DIAGNOSIS — R768 Other specified abnormal immunological findings in serum: Secondary | ICD-10-CM

## 2018-12-24 DIAGNOSIS — Z0184 Encounter for antibody response examination: Secondary | ICD-10-CM

## 2018-12-24 DIAGNOSIS — M25562 Pain in left knee: Secondary | ICD-10-CM

## 2018-12-24 DIAGNOSIS — Z0000 Encounter for general adult medical examination without abnormal findings: Secondary | ICD-10-CM | POA: Diagnosis not present

## 2018-12-24 DIAGNOSIS — Z1329 Encounter for screening for other suspected endocrine disorder: Secondary | ICD-10-CM

## 2018-12-24 DIAGNOSIS — Z23 Encounter for immunization: Secondary | ICD-10-CM | POA: Diagnosis not present

## 2018-12-24 DIAGNOSIS — Z1389 Encounter for screening for other disorder: Secondary | ICD-10-CM

## 2018-12-24 DIAGNOSIS — E611 Iron deficiency: Secondary | ICD-10-CM | POA: Diagnosis not present

## 2018-12-24 DIAGNOSIS — E559 Vitamin D deficiency, unspecified: Secondary | ICD-10-CM

## 2018-12-24 DIAGNOSIS — G8929 Other chronic pain: Secondary | ICD-10-CM

## 2018-12-24 DIAGNOSIS — Z1159 Encounter for screening for other viral diseases: Secondary | ICD-10-CM

## 2018-12-24 NOTE — Addendum Note (Signed)
Addended by: Takashi Korol T on: 12/24/2018 02:38 PM   Modules accepted: Orders  

## 2018-12-24 NOTE — Addendum Note (Signed)
Addended byElpidio Galea T on: 12/24/2018 02:38 PM   Modules accepted: Orders

## 2018-12-24 NOTE — Addendum Note (Signed)
Addended by: Kamrin Spath T on: 12/24/2018 02:38 PM   Modules accepted: Orders  

## 2018-12-25 ENCOUNTER — Telehealth: Payer: Self-pay | Admitting: Internal Medicine

## 2018-12-25 LAB — URINALYSIS, ROUTINE W REFLEX MICROSCOPIC
Bacteria, UA: NONE SEEN /HPF
Bilirubin Urine: NEGATIVE
Glucose, UA: NEGATIVE
Hgb urine dipstick: NEGATIVE
Hyaline Cast: NONE SEEN /LPF
Ketones, ur: NEGATIVE
Nitrite: NEGATIVE
Protein, ur: NEGATIVE
RBC / HPF: NONE SEEN /HPF (ref 0–2)
Specific Gravity, Urine: 1.016 (ref 1.001–1.03)
Squamous Epithelial / HPF: NONE SEEN /HPF (ref <=5)
WBC, UA: NONE SEEN /HPF (ref 0–5)
pH: 7.5 (ref 5.0–8.0)

## 2018-12-25 NOTE — Telephone Encounter (Signed)
Duplicate please see results note.

## 2018-12-25 NOTE — Telephone Encounter (Signed)
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 Cecil [facility] ortho unless prefers [facility]  Urgent referral to ortho placed  Veins in her lower legs  -does she also want to see a vascular specialist?

## 2018-12-25 NOTE — Progress Notes (Signed)
Unable to leave message for patient to return call back for lab results, mailbox is full. [facility] may give results and obtain information.

## 2018-12-28 LAB — CBC WITH DIFFERENTIAL/PLATELET
Absolute Monocytes: 560 cells/uL (ref 200–950)
Basophils Absolute: 88 cells/uL (ref 0–200)
Basophils Relative: 1.1 %
Eosinophils Absolute: 152 cells/uL (ref 15–500)
Eosinophils Relative: 1.9 %
HCT: 41 % (ref 35.0–45.0)
Hemoglobin: 13.7 g/dL (ref 11.7–15.5)
Lymphs Abs: 2560 cells/uL (ref 850–3900)
MCH: 30.6 pg (ref 27.0–33.0)
MCHC: 33.4 g/dL (ref 32.0–36.0)
MCV: 91.5 fL (ref 80.0–100.0)
MPV: 12.3 fL (ref 7.5–12.5)
Monocytes Relative: 7 %
Neutro Abs: 4640 cells/uL (ref 1500–7800)
Neutrophils Relative %: 58 %
Platelets: 260 10*3/uL (ref 140–400)
RBC: 4.48 10*6/uL (ref 3.80–5.10)
RDW: 12.4 % (ref 11.0–15.0)
Total Lymphocyte: 32 %
WBC: 8 10*3/uL (ref 3.8–10.8)

## 2018-12-28 LAB — MEASLES/MUMPS/RUBELLA IMMUNITY
Mumps IgG: 16.1 AU/mL
Rubella: 1.38 index
Rubeola IgG: 87.2 AU/mL

## 2018-12-28 LAB — ANTI-NUCLEAR AB-TITER (ANA TITER): ANA Titer 1: 1:80 {titer} — ABNORMAL HIGH

## 2018-12-28 LAB — IRON,TIBC AND FERRITIN PANEL
%SAT: 30 % (calc) (ref 16–45)
Ferritin: 21 ng/mL (ref 16–232)
Iron: 95 ug/dL (ref 45–160)
TIBC: 318 mcg/dL (calc) (ref 250–450)

## 2018-12-28 LAB — TSH: TSH: 1.57 mIU/L

## 2018-12-28 LAB — ANA: Anti Nuclear Antibody (ANA): POSITIVE — AB

## 2018-12-28 LAB — VITAMIN D 25 HYDROXY (VIT D DEFICIENCY, FRACTURES): Vit D, 25-Hydroxy: 30 ng/mL (ref 30–100)

## 2018-12-28 LAB — HEPATITIS B SURFACE ANTIBODY, QUANTITATIVE: Hep B S AB Quant (Post): <5 m[IU]/mL — ABNORMAL LOW (ref >=10)

## 2019-02-09 DIAGNOSIS — M1712 Unilateral primary osteoarthritis, left knee: Secondary | ICD-10-CM | POA: Diagnosis not present

## 2019-02-09 DIAGNOSIS — M25562 Pain in left knee: Secondary | ICD-10-CM | POA: Diagnosis not present

## 2019-02-09 DIAGNOSIS — G8929 Other chronic pain: Secondary | ICD-10-CM | POA: Diagnosis not present

## 2019-02-20 IMAGING — MG MM DIGITAL DIAGNOSTIC UNILAT*R* W/ TOMO W/ CAD
4 series · 4 of 12 positions shown · non-contrast
Comparison: 02/11/2018

CLINICAL DATA: Possible mass seen in the right breast on recent
screening mammogram.

EXAM:
DIGITAL DIAGNOSTIC RIGHT MAMMOGRAM WITH CAD AND TOMO
ULTRASOUND RIGHT BREAST

[R CC synth-2D]
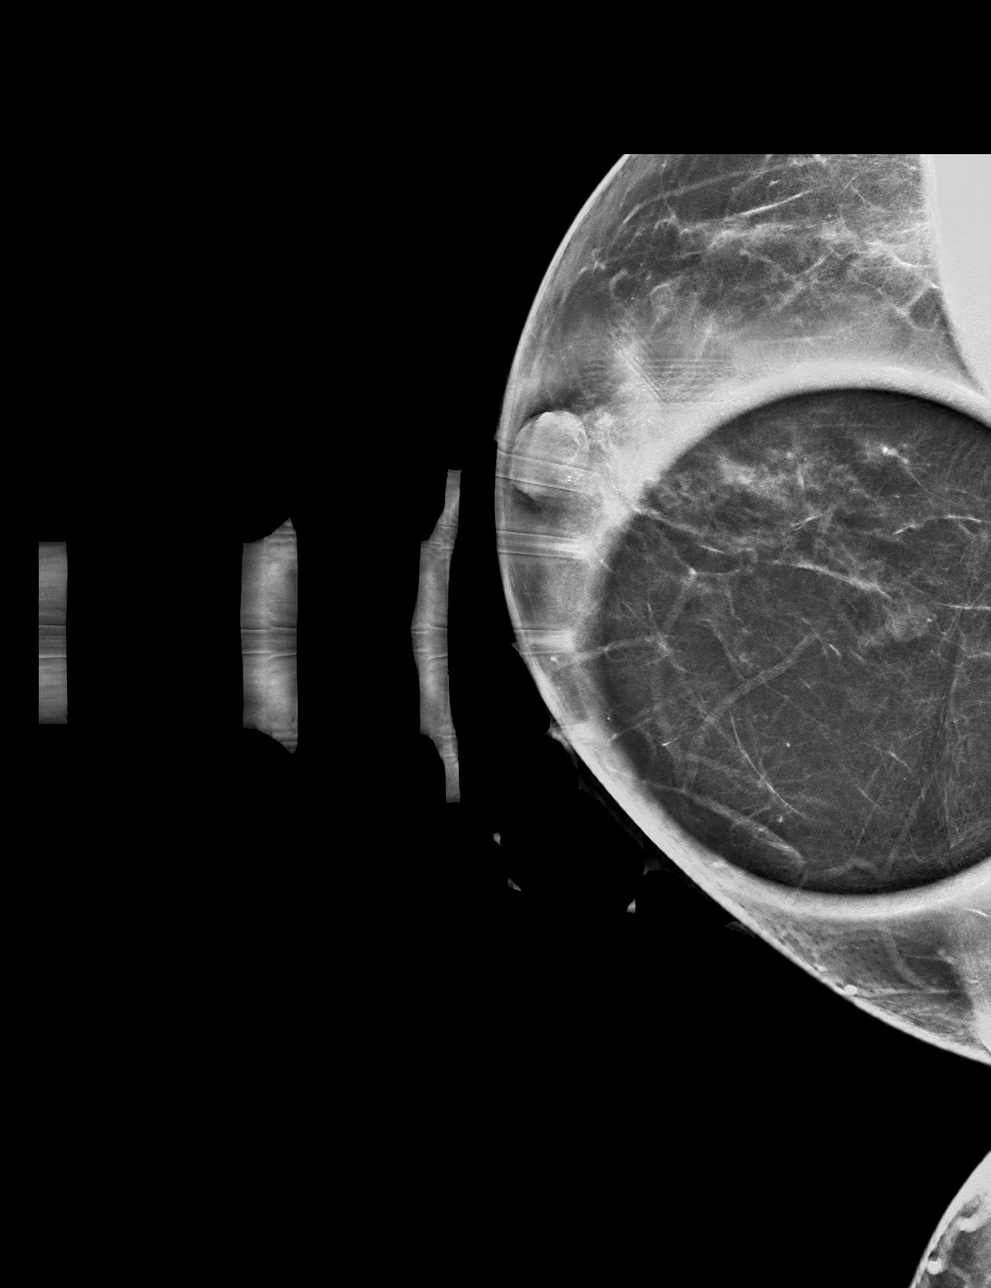

[R MLO synth-2D]
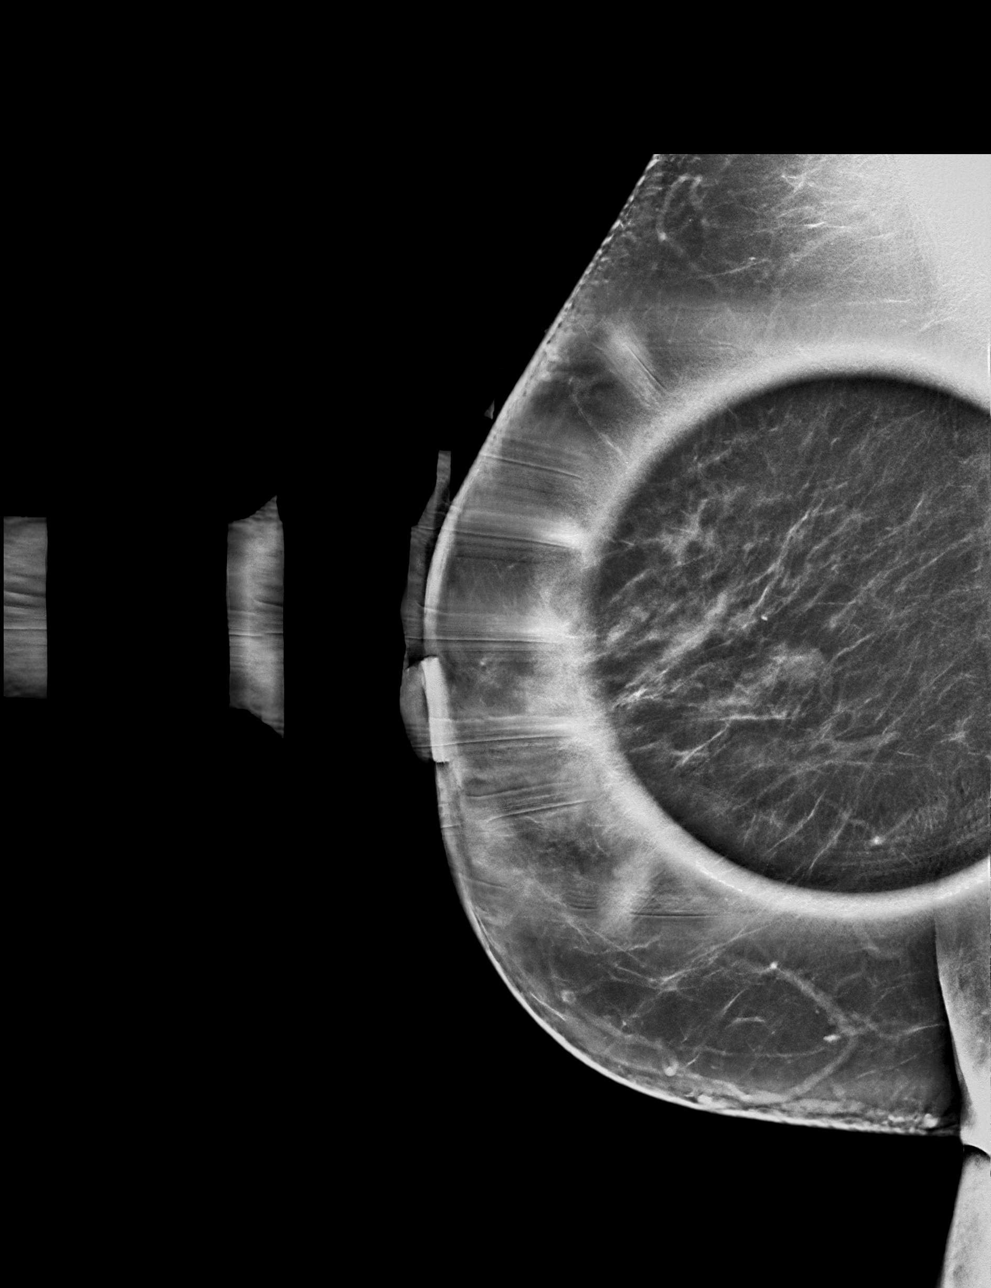

[R CC tomo · tomo slice 30/59.0]
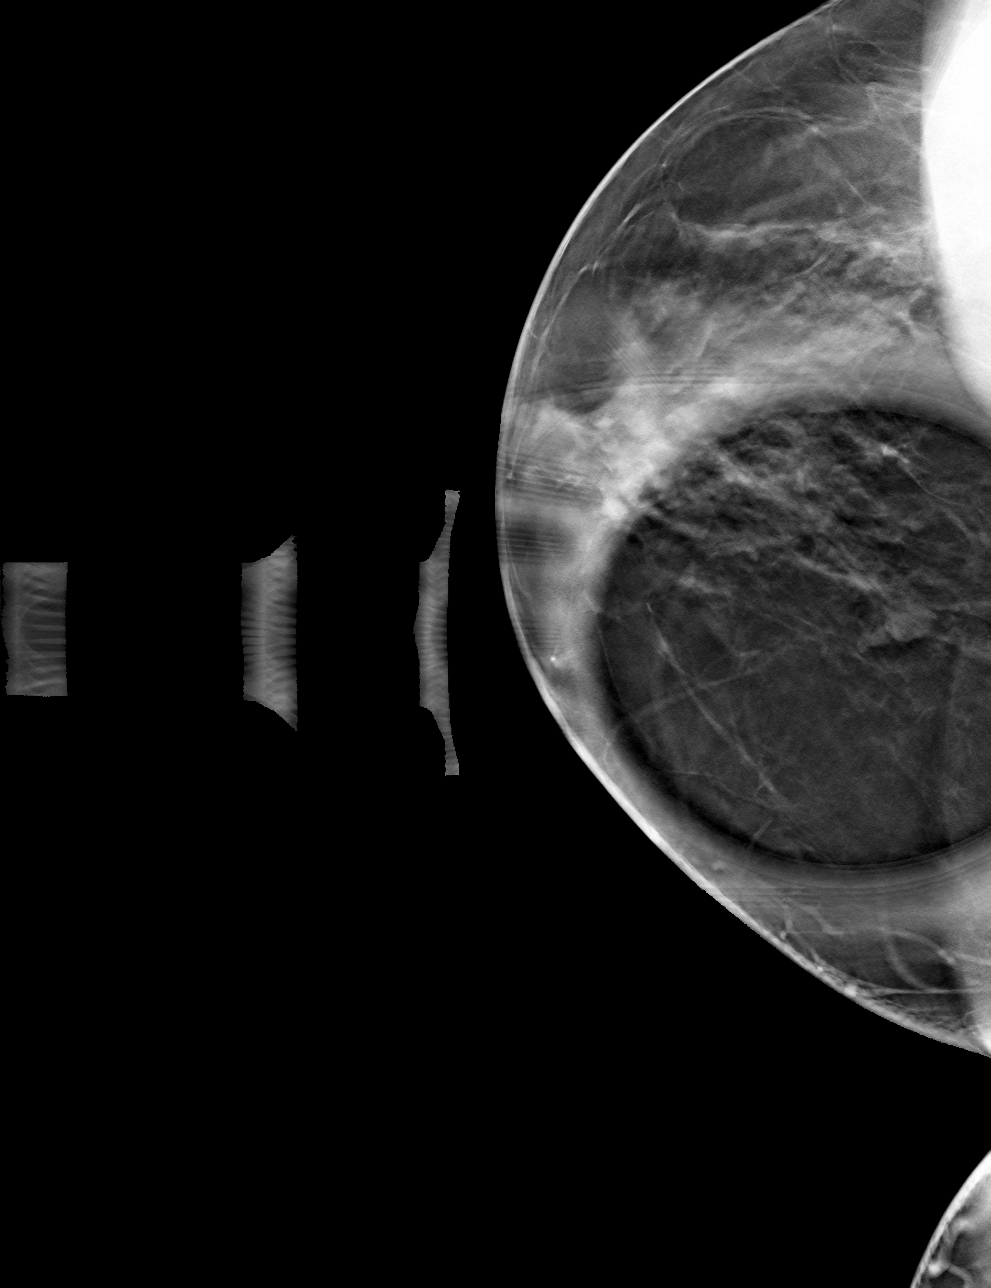

[R MLO tomo · tomo slice 36/71.0]
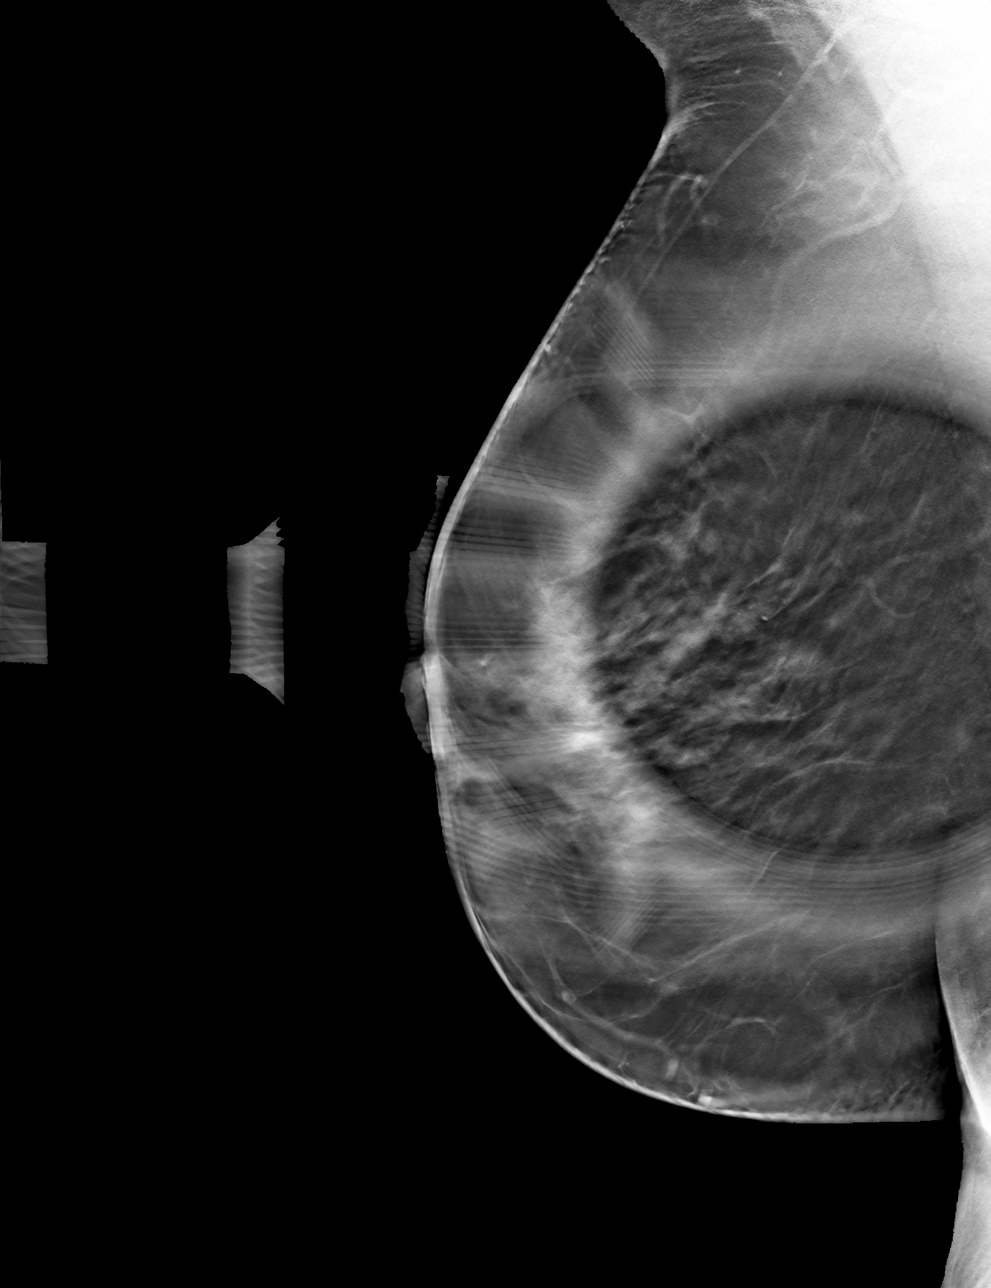

[4 of 12 positions shown; findings below may reference images not displayed]

ACR Breast Density Category b: There are scattered areas of
fibroglandular density.
FINDINGS: Spot compression views of upper inner right breast confirm
circumscribed oval approximately 0.7 cm mass. Immediately adjacent
to this is a smaller circumscribed oval nodule.

Mammographic images were processed with CAD.

On physical exam, no mass is palpated in the upper inner right
breast in the expected location of the mass.

Targeted ultrasound is performed, showing a circumscribed oval cyst
with a few internal septations at 1 o'clock position 2 cm from the
nipple measuring 0.7 x 0.7 x 0.5 cm. There is no associated vascular
flow. This corresponds to the mammographically detected mass.
Immediately adjacent to this cyst is a smaller cluster of cysts. No
suspicious mass identified.
IMPRESSION: Benign cysts.  No evidence of malignancy.

RECOMMENDATION:
Screening mammogram in one year.(Code:[id number])

I have discussed the findings and recommendations with the patient.
Results were also provided in writing at the conclusion of the
visit. If applicable, a reminder letter will be sent to the patient
regarding the next appointment.

BI-RADS CATEGORY  2: Benign.

## 2019-03-11 ENCOUNTER — Ambulatory Visit: Payer: Self-pay | Admitting: Internal Medicine

## 2019-04-04 ENCOUNTER — Other Ambulatory Visit: Payer: Self-pay | Admitting: Internal Medicine

## 2019-04-04 DIAGNOSIS — E559 Vitamin D deficiency, unspecified: Secondary | ICD-10-CM

## 2019-04-04 MED ORDER — CHOLECALCIFEROL 1.25 MG (50000 UT) PO CAPS: 50000.0000 [IU] | ORAL_CAPSULE | ORAL | 0 refills | Status: DC

## 2019-04-13 ENCOUNTER — Ambulatory Visit
Admission: RE | Admit: 2019-04-13 | Discharge: 2019-04-13 | Disposition: A | Discharge status: Home / Self Care | Payer: BC Managed Care – PPO | Source: Ambulatory Visit | Attending: Obstetrics and Gynecology | Admitting: Obstetrics and Gynecology

## 2019-04-13 ENCOUNTER — Encounter: Payer: Self-pay | Admitting: Obstetrics and Gynecology

## 2019-04-13 DIAGNOSIS — Z1231 Encounter for screening mammogram for malignant neoplasm of breast: Secondary | ICD-10-CM | POA: Diagnosis not present

## 2019-04-13 DIAGNOSIS — Z1239 Encounter for other screening for malignant neoplasm of breast: Secondary | ICD-10-CM

## 2019-05-03 ENCOUNTER — Telehealth: Payer: Self-pay | Admitting: Internal Medicine

## 2019-05-03 NOTE — Telephone Encounter (Signed)
Unable to leave message for patient to return call back.VM box is full 

## 2019-05-03 NOTE — Telephone Encounter (Signed)
Patient has been informed.

## 2019-05-03 NOTE — Telephone Encounter (Signed)
Pt is out of refills for her Vitamin D

## 2019-05-03 NOTE — Telephone Encounter (Signed)
She can take D3 5000 I(five thosand) IU daily over the counter  Inform pt

## 2019-09-15 ENCOUNTER — Other Ambulatory Visit: Payer: Self-pay

## 2019-09-17 ENCOUNTER — Other Ambulatory Visit: Payer: Self-pay

## 2019-09-17 ENCOUNTER — Ambulatory Visit (INDEPENDENT_AMBULATORY_CARE_PROVIDER_SITE_OTHER): Payer: BC Managed Care – PPO | Admitting: Internal Medicine

## 2019-09-17 ENCOUNTER — Encounter: Payer: Self-pay | Admitting: Internal Medicine

## 2019-09-17 VITALS — BP 120/82 | HR 74 | Temp 97.7°F | Ht 61.0 in | Wt 172.2 lb

## 2019-09-17 DIAGNOSIS — Z0000 Encounter for general adult medical examination without abnormal findings: Secondary | ICD-10-CM

## 2019-09-17 DIAGNOSIS — Z1329 Encounter for screening for other suspected endocrine disorder: Secondary | ICD-10-CM | POA: Diagnosis not present

## 2019-09-17 DIAGNOSIS — L918 Other hypertrophic disorders of the skin: Secondary | ICD-10-CM

## 2019-09-17 DIAGNOSIS — R7303 Prediabetes: Secondary | ICD-10-CM | POA: Diagnosis not present

## 2019-09-17 DIAGNOSIS — E559 Vitamin D deficiency, unspecified: Secondary | ICD-10-CM

## 2019-09-17 DIAGNOSIS — E785 Hyperlipidemia, unspecified: Secondary | ICD-10-CM

## 2019-09-17 LAB — COMPREHENSIVE METABOLIC PANEL
ALT: 18 U/L (ref 0–35)
AST: 16 U/L (ref 0–37)
Albumin: 4 g/dL (ref 3.5–5.2)
Alkaline Phosphatase: 92 U/L (ref 39–117)
BUN: 16 mg/dL (ref 6–23)
CO2: 30 mEq/L (ref 19–32)
Calcium: 9.2 mg/dL (ref 8.4–10.5)
Chloride: 105 mEq/L (ref 96–112)
Creatinine, Ser: 0.56 mg/dL (ref 0.40–1.20)
GFR: 113.65 mL/min (ref >60.00)
Glucose, Bld: 100 mg/dL — ABNORMAL HIGH (ref 70–99)
Potassium: 4.1 mEq/L (ref 3.5–5.1)
Sodium: 139 mEq/L (ref 135–145)
Total Bilirubin: 0.4 mg/dL (ref 0.2–1.2)
Total Protein: 7.4 g/dL (ref 6.0–8.3)

## 2019-09-17 LAB — CBC WITH DIFFERENTIAL/PLATELET
Basophils Absolute: 0.1 10*3/uL (ref 0.0–0.1)
Basophils Relative: 1.1 % (ref 0.0–3.0)
Eosinophils Absolute: 0.1 10*3/uL (ref 0.0–0.7)
Eosinophils Relative: 1.7 % (ref 0.0–5.0)
HCT: 41.8 % (ref 36.0–46.0)
Hemoglobin: 13.9 g/dL (ref 12.0–15.0)
Lymphocytes Relative: 28.2 % (ref 12.0–46.0)
Lymphs Abs: 1.9 10*3/uL (ref 0.7–4.0)
MCHC: 33.3 g/dL (ref 30.0–36.0)
MCV: 91.2 fl (ref 78.0–100.0)
Monocytes Absolute: 0.4 10*3/uL (ref 0.1–1.0)
Monocytes Relative: 6.3 % (ref 3.0–12.0)
Neutro Abs: 4.3 10*3/uL (ref 1.4–7.7)
Neutrophils Relative %: 62.7 % (ref 43.0–77.0)
Platelets: 253 10*3/uL (ref 150.0–400.0)
RBC: 4.58 Mil/uL (ref 3.87–5.11)
RDW: 13.6 % (ref 11.5–15.5)
WBC: 6.9 10*3/uL (ref 4.0–10.5)

## 2019-09-17 LAB — LIPID PANEL
Cholesterol: 219 mg/dL — ABNORMAL HIGH (ref 0–200)
HDL: 40.3 mg/dL (ref >39.00)
LDL Cholesterol: 144 mg/dL — ABNORMAL HIGH (ref 0–99)
NonHDL: 178.94
Total CHOL/HDL Ratio: 5
Triglycerides: 175 mg/dL — ABNORMAL HIGH (ref 0.0–149.0)
VLDL: 35 mg/dL (ref 0.0–40.0)

## 2019-09-17 LAB — HEMOGLOBIN A1C: Hgb A1c MFr Bld: 6.2 % (ref 4.6–6.5)

## 2019-09-17 LAB — TSH: TSH: 1.65 u[IU]/mL (ref 0.35–4.50)

## 2019-09-17 LAB — VITAMIN D 25 HYDROXY (VIT D DEFICIENCY, FRACTURES): VITD: 56.31 ng/mL (ref 30.00–100.00)

## 2019-09-17 NOTE — Patient Instructions (Addendum)
Consider colonoscopy Hepatitis B vaccine covid vaccine  shingrix (shingles vaccines   Flu shot not had 2020/2021  12/24/2018 Tdap  Consider covid vaccine, shingirx   Hep B not immune <5 12/24/2018 consider in future  MMR immune  Pap 08/23/16 negative no comment on HPV -pap had 12/07/18  Satisfactory for evaluation endocervical/transformation zone component PRESENT.    Diagnosis NEGATIVE FOR INTRAEPITHELIAL LESIONS OR MALIGNANCY.   Diagnosis CELLULAR CHANGES CONSISTENT WITH HYPERKERATOSIS.   HPV NOT Detected   Comment: Normal Reference Range - NOT Detected     colonoscopy needed in the future  Mammogram 01/17/17 negative, 04/13/2019 negative   Results for DYNASTI, KERMAN (MRN [id number]) as of 09/17/2019 10:39  Ref. Range 10/15/2017 10:33 12/24/2018 14:42  Anti Nuclear Antibody (ANA) Latest Ref Range: NEGATIVE  POSITIVE (A) POSITIVE (A)  ANA Pattern 1 Unknown SPECKLED (A) Cytoplasmic (A)  ANA Titer 1 Latest Units: titer 1:80 (H) 4:25 (H)  Cyclic Citrullin Peptide Ab Latest Units: UNITS <16   RA Latex Turbid. Latest Ref Range: <14 IU/mL <14      Colonoscopy, Adult A colonoscopy is a procedure to look at the entire large intestine. This procedure is done using a long, thin, flexible tube that has a camera on the end. You may have a colonoscopy:  As a part of normal colorectal screening.  If you have certain symptoms, such as: ? A low number of red blood cells in your blood (anemia). ? Diarrhea that does not go away. ? Pain in your abdomen. ? Blood in your stool. A colonoscopy can help screen for and diagnose medical problems, including:  Tumors.  Extra tissue that grows where mucus forms (polyps).  Inflammation.  Areas of bleeding. Tell your health care provider about:  Any allergies you have.  All medicines you are taking, including vitamins, herbs, eye drops, creams, and over-the-counter medicines.  Any problems you or family members have had with anesthetic  medicines.  Any blood disorders you have.  Any surgeries you have had.  Any medical conditions you have.  Any problems you have had with having bowel movements.  Whether you are pregnant or may be pregnant. What are the risks? Generally, this is a safe procedure. However, problems may occur, including:  Bleeding.  Damage to your intestine.  Allergic reactions to medicines given during the procedure.  Infection. This is rare. What happens before the procedure? Eating and drinking restrictions Follow instructions from your health care provider about eating or drinking restrictions, which may include:  A few days before the procedure: ? Follow a low-fiber diet. ? Avoid nuts, seeds, dried fruit, raw fruits, and vegetables.  1-3 days before the procedure: ? Eat only gelatin dessert or ice pops. ? Drink only clear liquids, such as water, clear juice, clear broth or bouillon, black coffee or tea, or clear soft drinks or sports drinks. ? Avoid liquids that contain red or purple dye.  The day of the procedure: ? Do not eat solid foods. You may continue to drink clear liquids until up to 2 hours before the procedure. ? Do not eat or drink anything starting 2 hours before the procedure, or within the time period that your health care provider recommends. Bowel prep If you were prescribed a bowel prep to take by mouth (orally) to clean out your colon:  Take it as told by your health care provider. Starting the day before your procedure, you will need to drink a large amount of liquid medicine. The liquid will cause  you to have many bowel movements of loose stool until your stool becomes almost clear or light green.  If your skin or the opening between the buttocks (anus) gets irritated from diarrhea, you may relieve the irritation using: ? Wipes with medicine in them, such as adult wet wipes with aloe and vitamin E. ? A product to soothe skin, such as petroleum jelly.  If you vomit  while drinking the bowel prep: ? Take a break for up to 60 minutes. ? Begin the bowel prep again. ? Call your health care provider if you keep vomiting or you cannot take the bowel prep without vomiting.  To clean out your colon, you may also be given: ? Laxative medicines. These help you have a bowel movement. ? Instructions for enema use. An enema is liquid medicine injected into your rectum. Medicines Ask your health care provider about:  Changing or stopping your regular medicines or supplements. This is especially important if you are taking iron supplements, diabetes medicines, or blood thinners.  Taking medicines such as aspirin and ibuprofen. These medicines can thin your blood. Do not take these medicines unless your health care provider tells you to take them.  Taking over-the-counter medicines, vitamins, herbs, and supplements. General instructions  Ask your health care provider what steps will be taken to help prevent infection. These may include washing skin with a germ-killing soap.  Plan to have someone take you home from the hospital or clinic. What happens during the procedure?   An IV will be inserted into one of your veins.  You may be given one or more of the following: ? A medicine to help you relax (sedative). ? A medicine to numb the area (local anesthetic). ? A medicine to make you fall asleep (general anesthetic). This is rarely needed.  You will lie on your side with your knees bent.  The tube will: ? Have oil or gel put on it (be lubricated). ? Be inserted into your anus. ? Be gently eased through all parts of your large intestine.  Air will be sent into your colon to keep it open. This may cause some pressure or cramping.  Images will be taken with the camera and will appear on a screen.  A small tissue sample may be removed to be looked at under a microscope (biopsy). The tissue may be sent to a lab for testing if any signs of problems are  found.  If small polyps are found, they may be removed and checked for cancer cells.  When the procedure is finished, the tube will be removed. The procedure may vary among health care providers and hospitals. What happens after the procedure?  Your blood pressure, heart rate, breathing rate, and blood oxygen level will be monitored until you leave the hospital or clinic.  You may have a small amount of blood in your stool.  You may pass gas and have mild cramping or bloating in your abdomen. This is caused by the air that was used to open your colon during the exam.  Do not drive for 24 hours after the procedure.  It is up to you to get the results of your procedure. Ask your health care provider, or the department that is doing the procedure, when your results will be ready. Summary  A colonoscopy is a procedure to look at the entire large intestine.  Follow instructions from your health care provider about eating and drinking before the procedure.  If you were prescribed  an oral bowel prep to clean out your colon, take it as told by your health care provider.  During the colonoscopy, a flexible tube with a camera on its end is inserted into the anus and then passed into the other parts of the large intestine. This information is not intended to replace advice given to you by your health care provider. Make sure you discuss any questions you have with your health care provider. Document Revised: 11/20/2018 Document Reviewed: 11/20/2018 Elsevier Patient Education  [place].

## 2019-09-17 NOTE — Progress Notes (Signed)
Chief Complaint  Patient presents with  . Annual Exam   Annual  1. Vit d def check labs today  2. Left lower back skin tag 1.5-2 cm pt wants removed today    Review of Systems  Constitutional: Negative for weight loss.  HENT: Negative for hearing loss.   Eyes: Negative for blurred vision.  Respiratory: Negative for shortness of breath.   Cardiovascular: Negative for chest pain.  Musculoskeletal: Negative for falls.  Skin: Negative for rash.  Neurological: Negative for headaches.  Psychiatric/Behavioral: Negative for depression.   Past Medical History:  Diagnosis Date  . Breast cyst 04/15/2014   simple cyst  . History of headache   . History of mammogram    Birad 2 mammogram done prior to 07/20/15 appt  . Prediabetes   . Vitamin D deficiency    Past Surgical History:  Procedure Laterality Date  . CESAREAN SECTION  1994   Family History  Problem Relation Age of Onset  . Other Mother        blood clot in leg  . Drug abuse Brother   . Alzheimer's disease Maternal Grandmother   . Breast cancer Neg Hx    Social History   Socioeconomic History  . Marital status: Legally Separated    Spouse name: Not on file  . Number of children: 4  . Years of education: College  . Highest education level: Not on file  Occupational History    Employer: OTHER    Comment: [employer]  Tobacco Use  . Smoking status: Never Smoker  . Smokeless tobacco: Never Used  Substance and Sexual Activity  . Alcohol use: Yes    Alcohol/week: 0.0 standard drinks    Comment: occasionally  . Drug use: No  . Sexual activity: Yes    Birth control/protection: None  Other Topics Concern  . Not on file  Social History Narrative   Patient lives at home with family husband    Husband and 4 children, and mother-in-Law    From [place]   1 son going to [facility] Fall 2019 to play football   Caffeine- 1 cup daily    Exercise- walking    Works at [employer] Strain:   . Difficulty of Paying Living Expenses:   Food Insecurity:   . Worried About Charity fundraiser in the Last Year:   . Arboriculturist in the Last Year:   Transportation Needs:   . Film/video editor (Medical):   Marland Kitchen Lack of Transportation (Non-Medical):   Physical Activity:   . Days of Exercise per Week:   . Minutes of Exercise per Session:   Stress:   . Feeling of Stress :   Social Connections:   . Frequency of Communication with Friends and Family:   . Frequency of Social Gatherings with Friends and Family:   . Attends Religious Services:   . Active Member of Clubs or Organizations:   . Attends Archivist Meetings:   Marland Kitchen Marital Status:   Intimate Partner Violence:   . Fear of Current or Ex-Partner:   . Emotionally Abused:   Marland Kitchen Physically Abused:   . Sexually Abused:    Current Meds  Medication Sig  . Cholecalciferol (VITAMIN D-3) 25 MCG (1000 UT) CAPS Take by mouth daily. 06-3998 units   No Known Allergies No results found for this or any previous visit (from the past 2160 hour(s)).  Objective  Body mass index is 32.54 kg/m. Wt Readings from Last 3 Encounters:  09/17/19 172 lb 3.2 oz (78.1 kg)  12/07/18 175 lb 12.8 oz (79.7 kg)  11/26/17 170 lb 6.4 oz (77.3 kg)   Temp Readings from Last 3 Encounters:  09/17/19 97.7 F (36.5 C) (Temporal)  11/26/17 98.7 F (37.1 C) (Oral)  10/13/17 98.4 F (36.9 C) (Oral)   BP Readings from Last 3 Encounters:  09/17/19 120/82  12/07/18 100/60  11/26/17 116/68   Pulse Readings from Last 3 Encounters:  09/17/19 74  11/26/17 79  10/13/17 78    Physical Exam Vitals and nursing note reviewed.  Constitutional:      Appearance: Normal appearance. She is well-developed and well-groomed. She is obese.  HENT:     Head: Normocephalic and atraumatic.  Eyes:     Conjunctiva/sclera: Conjunctivae normal.     Pupils: Pupils are equal, round, and reactive to light.  Cardiovascular:      Rate and Rhythm: Normal rate and regular rhythm.     Heart sounds: Normal heart sounds. No murmur.  Pulmonary:     Effort: Pulmonary effort is normal.     Breath sounds: Normal breath sounds.  Abdominal:     General: Abdomen is flat. Bowel sounds are normal.     Tenderness: There is no abdominal tenderness.  Skin:    General: Skin is warm and dry.  Neurological:     General: No focal deficit present.     Mental Status: She is alert and oriented to person, place, and time. Mental status is at baseline.     Gait: Gait normal.  Psychiatric:        Attention and Perception: Attention and perception normal.        Mood and Affect: Mood and affect normal.        Speech: Speech normal.        Behavior: Behavior normal. Behavior is cooperative.        Thought Content: Thought content normal.        Cognition and Memory: Cognition normal.        Judgment: Judgment normal.     Assessment  Plan  Well adult exam -  Fasting labs today  Flu shot not had 2020/2021  12/24/2018 Tdap  Had covid vaccine x 2 doses ? Door as of 09/17/19  Consider shingirx   Hep B not immune <5 12/24/2018 consider in future  MMR immune  Pap 08/23/16 negative no comment on HPV -pap had 12/07/18  Satisfactory for evaluation endocervical/transformation zone component PRESENT.    Diagnosis NEGATIVE FOR INTRAEPITHELIAL LESIONS OR MALIGNANCY.   Diagnosis CELLULAR CHANGES CONSISTENT WITH HYPERKERATOSIS.   HPV NOT Detected   Comment: Normal Reference Range - NOT Detected   colonoscopy needed in the future FOBT neg  12/07/2018  Mammogram 01/17/17 negative, 04/13/2019 negative rec healthy diet and exercise   Skin tag - Plan: Pathology ([facility])  Pt consented and tolerate left lower back skin tag removal Cleaned with sterile solution removed with 15 blade and tweezers and sent path   Provider: Dr. Olivia Mackie McLean-Scocuzza-[facility]

## 2019-09-20 ENCOUNTER — Telehealth: Payer: Self-pay | Admitting: Internal Medicine

## 2019-09-20 NOTE — Telephone Encounter (Signed)
Patient informed of lab results. 

## 2019-09-20 NOTE — Telephone Encounter (Signed)
Pt is returning phone call. Please call pt back regarding labs.

## 2019-09-22 LAB — ANATOMIC PATHOLOGY REPORT
